# Patient Record
Sex: Male | Born: 1950
Health system: Southern US, Community
[De-identification: ages and names within clinical notes are randomized; demographics above are authoritative.]

## PROBLEM LIST (undated history)

## (undated) DIAGNOSIS — I4891 Unspecified atrial fibrillation: Secondary | ICD-10-CM

## (undated) DIAGNOSIS — N400 Enlarged prostate without lower urinary tract symptoms: Secondary | ICD-10-CM

## (undated) DIAGNOSIS — K635 Polyp of colon: Secondary | ICD-10-CM

## (undated) DIAGNOSIS — F329 Major depressive disorder, single episode, unspecified: Secondary | ICD-10-CM

## (undated) DIAGNOSIS — I2510 Atherosclerotic heart disease of native coronary artery without angina pectoris: Secondary | ICD-10-CM

## (undated) DIAGNOSIS — E785 Hyperlipidemia, unspecified: Secondary | ICD-10-CM

## (undated) DIAGNOSIS — M1990 Unspecified osteoarthritis, unspecified site: Secondary | ICD-10-CM

## (undated) DIAGNOSIS — Z8739 Personal history of other diseases of the musculoskeletal system and connective tissue: Secondary | ICD-10-CM

## (undated) DIAGNOSIS — F32A Depression, unspecified: Secondary | ICD-10-CM

## (undated) DIAGNOSIS — I10 Essential (primary) hypertension: Secondary | ICD-10-CM

## (undated) DIAGNOSIS — E669 Obesity, unspecified: Secondary | ICD-10-CM

## (undated) DIAGNOSIS — G4733 Obstructive sleep apnea (adult) (pediatric): Secondary | ICD-10-CM

## (undated) DIAGNOSIS — F419 Anxiety disorder, unspecified: Secondary | ICD-10-CM

## (undated) DIAGNOSIS — K219 Gastro-esophageal reflux disease without esophagitis: Secondary | ICD-10-CM

## (undated) HISTORY — PX: COLONOSCOPY: SHX174

## (undated) HISTORY — DX: Personal history of other diseases of the musculoskeletal system and connective tissue: Z87.39

## (undated) HISTORY — PX: TONSILLECTOMY AND ADENOIDECTOMY: SUR1326

## (undated) HISTORY — PX: BACK SURGERY: SHX140

## (undated) HISTORY — DX: Atherosclerotic heart disease of native coronary artery without angina pectoris: I25.10

## (undated) HISTORY — DX: Obesity, unspecified: E66.9

## (undated) HISTORY — DX: Essential (primary) hypertension: I10

## (undated) HISTORY — DX: Unspecified osteoarthritis, unspecified site: M19.90

## (undated) HISTORY — PX: CARPAL TUNNEL RELEASE: SHX101

## (undated) HISTORY — DX: Gastro-esophageal reflux disease without esophagitis: K21.9

## (undated) HISTORY — DX: Unspecified atrial fibrillation: I48.91

## (undated) HISTORY — DX: Depression, unspecified: F32.A

## (undated) HISTORY — DX: Obstructive sleep apnea (adult) (pediatric): G47.33

## (undated) HISTORY — DX: Major depressive disorder, single episode, unspecified: F32.9

## (undated) HISTORY — DX: Anxiety disorder, unspecified: F41.9

## (undated) HISTORY — PX: SHOULDER SURGERY: SHX246

## (undated) HISTORY — DX: Hyperlipidemia, unspecified: E78.5

## (undated) HISTORY — DX: Benign prostatic hyperplasia without lower urinary tract symptoms: N40.0

## (undated) HISTORY — DX: Polyp of colon: K63.5

## (undated) HISTORY — PX: KNEE ARTHROSCOPY: SHX127

---

## 1996-05-18 HISTORY — PX: CARDIAC CATHETERIZATION: SHX172

## 2000-07-16 ENCOUNTER — Ambulatory Visit (HOSPITAL_COMMUNITY): Admission: RE | Admit: 2000-07-16 | Discharge: 2000-07-16 | Payer: Self-pay | Admitting: Gastroenterology

## 2000-08-25 ENCOUNTER — Ambulatory Visit (HOSPITAL_BASED_OUTPATIENT_CLINIC_OR_DEPARTMENT_OTHER): Admission: RE | Admit: 2000-08-25 | Discharge: 2000-08-25 | Payer: Self-pay | Admitting: Otolaryngology

## 2001-12-21 ENCOUNTER — Encounter: Payer: Self-pay | Admitting: Otolaryngology

## 2001-12-21 ENCOUNTER — Encounter: Admission: RE | Admit: 2001-12-21 | Discharge: 2001-12-21 | Payer: Self-pay | Admitting: Otolaryngology

## 2003-05-19 HISTORY — PX: CARDIAC CATHETERIZATION: SHX172

## 2003-08-14 ENCOUNTER — Ambulatory Visit (HOSPITAL_COMMUNITY): Admission: RE | Admit: 2003-08-14 | Discharge: 2003-08-14 | Payer: Self-pay | Admitting: Cardiovascular Disease

## 2003-09-10 ENCOUNTER — Ambulatory Visit (HOSPITAL_BASED_OUTPATIENT_CLINIC_OR_DEPARTMENT_OTHER): Admission: RE | Admit: 2003-09-10 | Discharge: 2003-09-10 | Payer: Self-pay | Admitting: Internal Medicine

## 2004-05-13 ENCOUNTER — Encounter: Admission: RE | Admit: 2004-05-13 | Discharge: 2004-05-13 | Payer: Self-pay | Admitting: Internal Medicine

## 2006-04-13 ENCOUNTER — Ambulatory Visit (HOSPITAL_COMMUNITY): Admission: RE | Admit: 2006-04-13 | Discharge: 2006-04-14 | Payer: Self-pay | Admitting: Neurosurgery

## 2006-04-13 IMAGING — CR DG CERVICAL SPINE 2 OR 3 VIEWS
1 series · 1 of 1 positions shown · non-contrast
Comparison: none

CLINICAL DATA: 55-year-old, spondylolisthesis, stenosis, cervical fusion. 
 CERVICAL SPINE ? 2 LATERAL VIEWS FROM THE OPERATING ROOM:

[view not recorded]
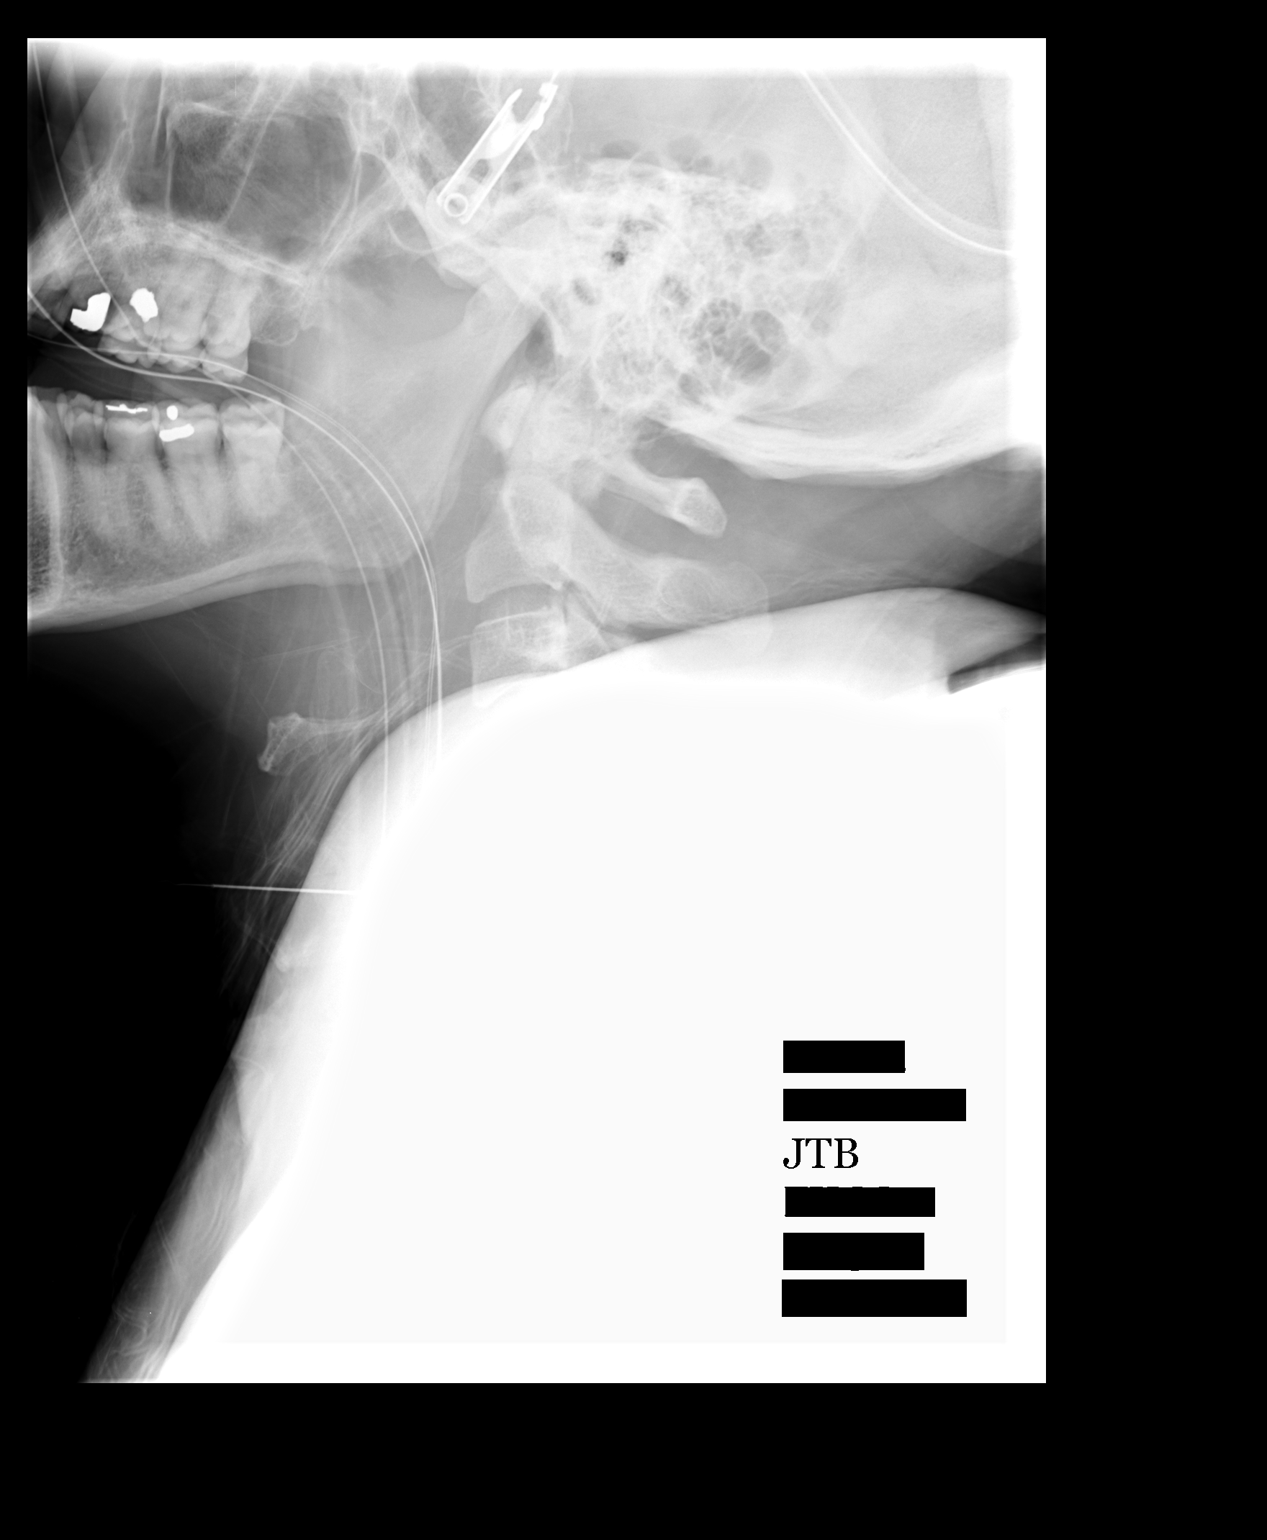

[1 of 1 positions shown; findings below may reference images not displayed]

FINDINGS: Lateral film labeled number 2 demonstrates a spinal needle projecting at the C4-5 disc space and C5-6 disc space.
IMPRESSION: C4-5 and C5-6 marked intraoperatively.

## 2010-02-18 ENCOUNTER — Ambulatory Visit: Payer: Self-pay | Admitting: Cardiology

## 2010-10-03 NOTE — Procedures (Signed)
[facility]  Patient:    Christopher Bradford, Christopher Bradford                    MRN: [id number] Proc. Date: 07/16/00 Adm. Date:  [id number] Disc. Date: [id number] Attending:  Nelda Marseille CC:         Joycelyn Rua, M.D.   Procedure Report  PROCEDURE:  Esophagogastroduodenoscopy with biopsy.  INDICATION:  Guaiac positivity.  Upper tract symptoms.  Nondiagnostic colonoscopy.  INFORMED CONSENT:  Consent was signed after risks, benefits, methods, and options were thoroughly discussed prior to any premedications given and in the office.  ADDITIONAL MEDICINES:  Versed 1 only.  DESCRIPTION OF PROCEDURE:  The video endoscope was inserted by direct vision. The proximal and mid esophagus were normal.  Distal esophagus was pertinent for a small hiatal hernia.  No signs of Barretts.  Possibly a minimal amount of distal esophagitis was seen.  The scope was inserted into the stomach and advanced to the antrum where some mild linear antritis was seen and advanced through a normal pylorus into the duodenal bulb, pertinent for some minimal bulbitis and around the C-loop to a normal second portion of the duodenum. The scope was withdrawn back to bulb, and a good look there ruled out ulcers in that location but confirmed the mild inflammation.  The scope was withdrawn back to the stomach and retroflexed.  Cardia, fundus, angularis, lesser and greater curve were normal on retroflexed and straight visualization, except for some minimal gastritis.  The scope was then advanced to the antrum.  One biopsy for the CLOtest to rule out Helicobacter was obtained.  Air was suctioned, and the scope was slowly withdrawn.  Again, a good look at the esophagus on slow withdrawal confirmed the above findings.  The scope was removed.  The patient tolerated the procedure well.  There was no obvious immediate complications.  ENDOSCOPIC DIAGNOSES: 1. Small hiatal hernia. 2. Mild linear antritis,  bulbitis, gastritis, status post CLOtest biopsy. 3. Otherwise normal limits EGD.  PLAN:  Will try Prevacid since Prilosec caused some diarrhea.  We will go ahead and see back in about six weeks to recheck guaiacs, possibly CBC, and symptoms and make sure no further work up plans are needed.  We will need to check the guaiacs off aspirin and nonsteroidals. DD:  07/16/00 TD:  07/18/00 Job: [id number]

## 2010-10-03 NOTE — H&P (Signed)
NAME:  Christopher Bradford, Christopher Bradford NO.:  [id number]   MEDICAL RECORD NO.:  [id number]                    PATIENT TYPE:   LOCATION:                                       FACILITY:   PHYSICIAN:  Vesta Mixer, M.D.              DATE OF BIRTH:   DATE OF ADMISSION:  08/14/2003  DATE OF DISCHARGE:                                HISTORY & PHYSICAL   Christopher Bradford is a middle-aged gentleman with a history of coronary artery disease.  He has history of inferior wall myocardial infarction with PTCA and stenting  of an occluded left circumflex artery by Dr. Delfin Edis in August 1998.  He had done fairly well until approximately one month ago when he started  having some unusual episodes of chest pain.  He thought that these episodes  of chest pain were due to a bad cold and then later thought it was due to a  new medication.  For the past week or so, his cold has resolved, and he  stopped the medication.  He still continues to have these episodes of chest  pain.  He describes the chest pain as a knot in his chest.  It seems to  worsen with exertion and seems to be relieved with rest.  He has also had  some episodes of tachy palpitations and irregular heart beat.  He has also  had occasional episodes when he gets very clammy, weak, and fatigued.  He  has not taken any nitroglycerin.   CURRENT MEDICATIONS:  1. Lotensin 40 mg a day.  2. Bayer aspirin once a day.  3. Zocor 40 mg a day.  4. Lexapro 5 mg a day (none in the last week).  5. Nexium 40 mg a day.  6. Zyrtec 5 mg a day.  7. Multivitamins once a day.   ALLERGIES:  None.   PAST MEDICAL HISTORY:  1. History of coronary artery disease, status post PTCA and stenting of left     circumflex artery in 1998.  2. Hypertension.  3. Hypercholesterolemia.   SOCIAL HISTORY:  The patient works in [employer].  He does not  smoke.   FAMILY HISTORY:  Noncontributory.   REVIEW OF SYSTEMS:  Was reviewed and is  essentially negative.   PHYSICAL EXAMINATION:  GENERAL:  He is a middle-aged gentleman in no acute  distress.  He is alert and oriented x 3, and his mood and affect are normal.  VITAL SIGNS:  Weight 286.  Blood pressure 138/88, heart rate 93.  HEAD AND NECK:  Reveals 2+ carotids.  He has no bruits.  There is no JVD nor  thyromegaly.  LUNGS:  Clear to auscultation.  HEART:  Regular rate, S1, S2, with no murmurs, gallops, or rubs.  ABDOMEN:  Good bowel sounds and nontender.  EXTREMITIES:  No clubbing, cyanosis, or edema.  NEUROLOGIC:  Exam nonfocal.   LABORATORY AND  X-RAY DATA:  EKG reveals normal sinus rhythm.  He has  inferior Q waves.  He has no ST or T wave changes.   IMPRESSION:  Christopher Bradford presents with some episodes of exertional chest pain.  I  have recommended that we perform a heart catheterization because of his  history of coronary disease and because of his symptoms.  We have discussed  the risks, benefits, and options of heart catheterization.  He understands  and agrees to proceed.                                                Vesta Mixer, M.D.    PJN/MEDQ  D:  08/13/2003  T:  08/13/2003  Job:  [id number]   cc:   Cassell Clement, M.D.  [address]  [place]  [place] [address]  Fax: [phone]

## 2010-10-03 NOTE — Cardiovascular Report (Signed)
NAME:  Christopher Bradford, BROZEK NO.:  [id number]   MEDICAL RECORD NO.:  [id number]                   PATIENT TYPE:  OIB   LOCATION:  2899                                 FACILITY:  [facility]   PHYSICIAN:  Vesta Mixer, M.D.              DATE OF BIRTH:  February 07, 1951   DATE OF PROCEDURE:  08/14/2003  DATE OF DISCHARGE:  08/14/2003                              CARDIAC CATHETERIZATION   Mr. Groene is a 60 year old gentleman with history of coronary artery  disease.  He is status post percutaneous transluminal coronary angioplasty  and stenting of his left circumflex marginal branch several years ago.  He  presented to the office the other day with symptoms consistent with unstable  angina.   PROCEDURE:  Left heart catheterization with coronary angiography.   The right femoral artery was easily cannulated using the modified Seldinger  technique.   HEMODYNAMICS:  The left ventricular pressure was 133/9 with an aortic  pressure of 133/66.   CORONARY ANGIOGRAPHY:  1. Left main coronary artery is smooth and normal.  2. The left anterior descending artery has minor luminal irregularities     especially in the proximal segment. These irregularities range between 10     and 20%.  The diagonal vessels are small, but otherwise normal.  3. The left circumflex artery is large vessel.  It gives off a large obtuse     marginal artery.  The stent is positioned in the proximal aspect of this     first OM.  This stent is widely patent.  4. The right coronary artery is a moderate size vessel and is dominant.  It     is fairly smooth and normal throughout its course.  The posterior     descending artery and the posterior lateral segment are normal.   LEFT VENTRICULOGRAM:  Left ventriculogram was performed in a 30-RAO  position.  It reveals overall normal left ventricular systolic function.  There is no significant mitral regurgitation.  There are no segmental wall  motion  abnormalities.   COMPLICATIONS:  None.   CONCLUSIONS:  Minor luminal irregularities.  The previously placed left  circumflex stent is widely patent.  We will continue with medical therapy.                                               Vesta Mixer, M.D.    PJN/MEDQ  D:  08/14/2003  T:  08/15/2003  Job:  [id number]   cc:   Cassell Clement, M.D.  [address]  [place]  [place] [address]  Fax: [phone]

## 2010-10-03 NOTE — Procedures (Signed)
[facility]  Patient:    Christopher Bradford, Christopher Bradford                    MRN: [id number] Proc. Date: 07/16/00 Adm. Date:  [id number] Disc. Date: [id number] Attending:  Nelda Marseille CC:         Joycelyn Rua, M.D.   Procedure Report  PROCEDURE:  Colonoscopy.  INDICATIONS:  Guaiac positivity, due for colonic screening.  INFORMED CONSENT:  Consent was signed after risk, benefits, methods and options were thoroughly discussed in the office.  MEDICINES USED:  Demerol 50 mg, Versed 6 mg.  DESCRIPTION OF PROCEDURE:  Rectal inspection was pertinent for external hemorrhoids, small.  Digital exam was negative.  The video colonoscope was inserted and easily advanced to the level of the ileocecal valve to advance to the cecum pole, required abdominal pressure.  The cecum was identified by the appendiceal orifice and the ileocecal valve.  The scope was inserted a short ways in the terminal ileum which was normal.  Photo documentation was obtained.  The scope was slowly withdrawn.  On insertion, no obvious abnormality was seen on slow withdrawal through the colon other than a rare, left-sided diverticula.  No abnormalities were seen.  The prep was adequate. There was some liquid stool that required washing and suctioning.  Once back in the rectum, the scope was retroflexed, pertinent for some internal hemorrhoids.  The scope was straightened, air was withdrawn, and the scope removed.  The patient tolerated the procedure well, and there was no obvious immediate complication.  ENDOSCOPIC DIAGNOSES: 1. Internal and external hemorrhoids. 2. Rare left-sided diverticula. 3. Otherwise within normal limits to the terminal ileum.  PLAN:  Continue work-up with an EGD and probably follow-up in 6-8 weeks to recheck guaiac symptoms and possibly CBC and decide any other work-up and plans.  Repeat screening in five years. DD:  07/16/00 TD:  07/18/00 Job: [id number]

## 2010-10-03 NOTE — Op Note (Signed)
NAME:  [name], [name] NO.:  [id number]   MEDICAL RECORD NO.:  [id number]          PATIENT TYPE:  AMB   LOCATION:  SDS                          FACILITY:  [facility]   PHYSICIAN:  Danae Orleans. Venetia Maxon, M.D.  DATE OF BIRTH:  12/01/1950   DATE OF PROCEDURE:  04/13/2006  DATE OF DISCHARGE:                               OPERATIVE REPORT   PREOPERATIVE DIAGNOSIS:  Herniated cervical disk with cervical  myelopathy, spondylosis with myelopathy, stenosis, degenerative disk  disease and cervical radiculopathy C5-6 and C6-7 levels.   POSTOPERATIVE DIAGNOSIS:  Herniated cervical disk with cervical  myelopathy, spondylosis with myelopathy, stenosis, degenerative disk  disease and cervical radiculopathy C5-6 and C6-7 levels.   PROCEDURE:  Anterior cervical decompression and fusion, C5-6 and C6-7  with PEEK interbody spacers, morselized bone autograft and Osteocel and  anterior cervical plate.   SURGEON:  Danae Orleans. Venetia Maxon, M.D.   ASSISTANT:  Hewitt Shorts, M.D.  Georgiann Cocker, RN   ANESTHESIA:  General endotracheal anesthesia.   BLOOD LOSS:  Minimal.   COMPLICATIONS:  None.   DISPOSITION:  Recovery.   INDICATIONS:  Christopher Bradford is a 60 year old man with cervical  myelopathy and cervical stenosis with herniated disks at C5-6 and C6-7  levels.  It was elected to take him to surgery for anterior cervical  decompression and fusion at C5-6 and C6-7 levels.   PROCEDURE:  Christopher Bradford was brought to the operating room.  Following  satisfactory and uncomplicated induction of general endotracheal  anesthesia and placement of intravenous lines, the patient was placed in  a supine position on the operating table.  The neck was placed in  neutral alignment.  He is placed in 10 pounds of halter traction and  anterior neck was then shaved, prepped and draped in the usual sterile  fashion.  Shoulders were wrapped with Kerlix and pulled because the  patient is approximately 270  pounds.  The area of planned incision was  infiltrated with 0.25% Marcaine and 0.5% lidocaine with 1:200,000  epinephrine.  Incision was made from midline to the anterior border of  sternocleidomastoid muscle, carried sharply through the platysma layer.  Subplatysmal dissection was performed exposing the anterior border of  the sternocleidomastoid muscle.  Using blunt dissection the carotid  sheath was kept lateral.  Trachea and esophagus kept medial exposing the  anterior cervical spine initially and intraoperative x-ray was obtained  with marking needle at the C5-6 level.  Subsequently it was not possible  to visualize this so an additional needle was placed at what was felt to  the C4-5 and this was confirmed on intraoperative x-ray.  Subsequently  the longus colli muscles were taken down from the anterior cervical  spine from C5-C7 with electrocautery and Key elevator and the large  anterior osteophytes were removed with Leksell rongeur at each of these  levels.  The self-retaining Shadow Line retractors were placed along  with the up and down retractors as well and the interspaces at each  these levels was then incised with 15 blade.  Disk material was removed  in piecemeal fashion.  The endplates  were stripped of residual disk  material using Carlens curettes and under loupe magnification using a  high-speed drill, the endplates of C5-C6 were decorticated as were  uncinates and uncinate spurs were drilled down and subsequently similar  decompression was performed at the C6-7 level.  The disk space spreader  was placed at C6-7 and the microscope was brought to the field and the  posterior longitudinal ligament was removed in piecemeal fashion using a  variety of gold-tip Kerrison rongeurs and both C7 nerve roots were  decompressed widely as they extended out the neural foramina as central  spinal cord dura.  Endplates had been decorticated and after trial  sizing, an 8-mm PEEK  interbody cage was selected, packed with  morcellized bone autograft which had been retained from the drilling of  the endplates along with Osteocel.  This was inserted in the interspace  and countersunk appropriately. Attention was then turned to the C5-6  level where similar decompression was performed.  At this level there  was a central disk herniation with compression of the spinal cord dura.  The posterior longitudinal ligament was removed in piecemeal fashion.  Both C6 nerve roots and central spinal cord dura were decompressed.  Hemostasis was assured with Gelfoam soaked in thrombin.  After trial  sizing, a 7-mm medium PEEK cage was selected, packed with similar  autograft and Osteocel and then inserted in the interspace countersunk  appropriately.  A 37 mm trestle anterior cervical plate was then affixed  to the anterior cervical spine using at C5, C6 and C7 levels using 14 mm  variable angle screws.  All screws had excellent purchase and locking  mechanisms were engaged.  The traction weight was removed prior to  placing the plate.  The wound was copiously irrigated with Bacitracin  saline.  The soft tissues were inspected and found to be good repair.  No evidence of any bleeding.  The platysma layer was then closed with 3-  0 Vicryl sutures and the skin edges reapproximated 3-0 Vicryl  interrupted inverted sutures.  The wound was dressed with Dermabond.  The patient was extubated in the operative room taken to recovery room  in stable satisfactory condition having tolerated his operation well.      Danae Orleans. Venetia Maxon, M.D.  Electronically Signed     JDS/MEDQ  D:  04/13/2006  T:  04/13/2006  Job:  [id number]

## 2010-10-14 ENCOUNTER — Encounter: Payer: Self-pay | Admitting: Cardiology

## 2010-10-14 ENCOUNTER — Encounter: Payer: Self-pay | Admitting: Internal Medicine

## 2011-04-16 ENCOUNTER — Encounter: Payer: Self-pay | Admitting: *Deleted

## 2011-04-16 DIAGNOSIS — Z8739 Personal history of other diseases of the musculoskeletal system and connective tissue: Secondary | ICD-10-CM | POA: Insufficient documentation

## 2011-04-16 DIAGNOSIS — I259 Chronic ischemic heart disease, unspecified: Secondary | ICD-10-CM | POA: Insufficient documentation

## 2011-04-20 ENCOUNTER — Encounter: Payer: Self-pay | Admitting: Cardiology

## 2011-04-20 ENCOUNTER — Ambulatory Visit (INDEPENDENT_AMBULATORY_CARE_PROVIDER_SITE_OTHER): Payer: Managed Care, Other (non HMO) | Admitting: Cardiology

## 2011-04-20 VITALS — BP 130/88 | HR 70 | Ht 70.0 in | Wt 230.0 lb

## 2011-04-20 DIAGNOSIS — E785 Hyperlipidemia, unspecified: Secondary | ICD-10-CM | POA: Insufficient documentation

## 2011-04-20 DIAGNOSIS — I119 Hypertensive heart disease without heart failure: Secondary | ICD-10-CM

## 2011-04-20 DIAGNOSIS — I259 Chronic ischemic heart disease, unspecified: Secondary | ICD-10-CM

## 2011-04-20 DIAGNOSIS — E7800 Pure hypercholesterolemia, unspecified: Secondary | ICD-10-CM

## 2011-04-20 NOTE — Assessment & Plan Note (Signed)
No chest pain.  No shortness of breath.  No dizziness or palpitations.

## 2011-04-20 NOTE — Progress Notes (Signed)
Christopher Bradford Date of Birth:  04-24-1951 [facility] [address]   [address] [place], [place]  [address] [phone]           Fax   [phone]  HPI: This pleasant 60 year old gentleman is seen for right one-year followup office visit.  He has been feeling well.  He hasn't passed history of coronary artery disease and had a stent placed to the circumflex in 1998.  His last cardiac catheterization in 2005 showed that the left circumflex stent was still widely patent.  Patient is not expressing any chest pain or shortness of breath.  Has had a problem with exogenous obesity and unfortunately has gained 11 pounds over the past year.  Current Outpatient Prescriptions  Medication Sig Dispense Refill  . amLODipine (NORVASC) 2.5 MG tablet Take 2.5 mg by mouth daily.        Marland Kitchen aspirin 81 MG tablet Take 81 mg by mouth daily.        Marland Kitchen azelastine (ASTELIN) 137 MCG/SPRAY nasal spray Place 1 spray into the nose 2 (two) times daily. Use in each nostril as directed       . benazepril (LOTENSIN) 40 MG tablet Take 40 mg by mouth daily.        . cetirizine (ZYRTEC) 5 MG tablet Take 5 mg by mouth daily.        . Coenzyme Q10 (CO Q 10 PO) Take by mouth daily.        . Multiple Vitamin (MULTIVITAMIN) tablet Take 1 tablet by mouth daily.        . Omega-3 Fatty Acids (FISH OIL PO) Take by mouth daily.        . Omeprazole (PRILOSEC PO) Take by mouth daily.        . simvastatin (ZOCOR) 40 MG tablet Take 40 mg by mouth at bedtime.          Allergies  Allergen Reactions  . Lotensin     diarrhea  . Penicillins   . Pravachol     diarrhea    Patient Active Problem List  Diagnoses  . Ischemic heart disease  . History of gout    History  Smoking status  . Former Smoker  . Types: Cigarettes  Smokeless tobacco  . Not on file    History  Alcohol Use     No family history on file.  Review of Systems: The patient denies any heat or cold intolerance.  No weight  gain or weight loss.  The patient denies headaches or blurry vision.  There is no cough or sputum production.  The patient denies dizziness.  There is no hematuria or hematochezia.  The patient denies any muscle aches or arthritis.  The patient denies any rash.  The patient denies frequent falling or instability.  There is no history of depression or anxiety.  All other systems were reviewed and are negative.   Physical Exam: Filed Vitals:   04/20/11 1454  BP: 130/88  Pulse: 70   the general appearance reveals a large gentleman in no distress.The head and neck exam reveals pupils equal and reactive.  Extraocular movements are full.  There is no scleral icterus.  The mouth and pharynx are normal.  The neck is supple.  The carotids reveal no bruits.  The jugular venous pressure is normal.  The  thyroid is not enlarged.  There is no lymphadenopathy.  The chest is clear to percussion and auscultation.  There are no  rales or rhonchi.  Expansion of the chest is symmetrical.  The precordium is quiet.  The first heart sound is normal.  The second heart sound is physiologically split.  There is no murmur gallop rub or click.  There is no abnormal lift or heave.  The abdomen is soft and nontender.  The bowel sounds are normal.  The liver and spleen are not enlarged.  There are no abdominal masses.  There are no abdominal bruits.  Extremities reveal good pedal pulses.  There is no phlebitis or edema.  There is no cyanosis or clubbing.  Strength is normal and symmetrical in all extremities.  There is no lateralizing weakness.  There are no sensory deficits.  The skin is warm and dry.  There is no rash.      Assessment / Plan: The patient needs to work harder on weight loss and needs to get into a regular exercise program.  Recheck here in one year for office visit and EKG.

## 2011-04-20 NOTE — Patient Instructions (Signed)
Your physician recommends that you continue on your current medications as directed. Please refer to the Current Medication list given to you today. Work harder on diet and exercise Your physician wants you to follow-up in: 1 year  You will receive a reminder letter in the mail two months in advance. If you don't receive a letter, please call our office to schedule the follow-up appointment.

## 2011-04-20 NOTE — Assessment & Plan Note (Signed)
The patient has a history of dyslipidemia and is on simvastatin.  Is not having any side effects from simvastatin.  His lipids are followed by his primary care provider Dr. Eula Listen

## 2013-07-27 ENCOUNTER — Encounter: Payer: Self-pay | Admitting: Podiatry

## 2013-07-27 ENCOUNTER — Ambulatory Visit (INDEPENDENT_AMBULATORY_CARE_PROVIDER_SITE_OTHER): Payer: Managed Care, Other (non HMO) | Admitting: Podiatry

## 2013-07-27 DIAGNOSIS — M779 Enthesopathy, unspecified: Secondary | ICD-10-CM

## 2013-07-27 DIAGNOSIS — L84 Corns and callosities: Secondary | ICD-10-CM

## 2013-07-27 MED ORDER — TRIAMCINOLONE ACETONIDE 10 MG/ML IJ SUSP
10.0000 mg | Freq: Once | INTRAMUSCULAR | Status: AC
  Administered 2013-07-27: 10 mg

## 2013-07-29 NOTE — Progress Notes (Signed)
Subjective:     Patient ID: Christopher Bradford, male   DOB: [birth year], 64 y.o.   MRN: [id number]  HPI patient is found to have lesion with pain fifth metatarsal head left that has fluid buildup within the plantar capsule   Review of Systems     Objective:   Physical Exam Neurovascular status intact with inflammation around the head of the fifth metatarsal left that it's painful with keratotic lesion noted    Assessment:     Capsulitis left fifth MPJ with inflammation and pain and porokeratotic lesion    Plan:     Injected the plantar capsule 3 mg dexamethasone Kenalog combination along with Xylocaine and debridement the lesion

## 2014-01-12 ENCOUNTER — Ambulatory Visit: Payer: Managed Care, Other (non HMO) | Admitting: Podiatry

## 2014-01-23 ENCOUNTER — Other Ambulatory Visit: Payer: Self-pay | Admitting: Internal Medicine

## 2014-01-23 ENCOUNTER — Ambulatory Visit
Admission: RE | Admit: 2014-01-23 | Discharge: 2014-01-23 | Disposition: A | Discharge status: Home / Self Care | Payer: Self-pay | Source: Ambulatory Visit | Attending: Internal Medicine | Admitting: Internal Medicine

## 2014-01-23 DIAGNOSIS — M79672 Pain in left foot: Secondary | ICD-10-CM

## 2014-02-08 ENCOUNTER — Encounter: Payer: Self-pay | Admitting: Podiatry

## 2014-02-08 ENCOUNTER — Ambulatory Visit (INDEPENDENT_AMBULATORY_CARE_PROVIDER_SITE_OTHER): Payer: BC Managed Care – PPO | Admitting: Podiatry

## 2014-02-08 ENCOUNTER — Ambulatory Visit (INDEPENDENT_AMBULATORY_CARE_PROVIDER_SITE_OTHER): Payer: BC Managed Care – PPO

## 2014-02-08 VITALS — BP 163/87 | HR 67 | Resp 15 | Ht 69.0 in | Wt 230.0 lb

## 2014-02-08 DIAGNOSIS — M79673 Pain in unspecified foot: Secondary | ICD-10-CM

## 2014-02-08 DIAGNOSIS — L84 Corns and callosities: Secondary | ICD-10-CM

## 2014-02-08 DIAGNOSIS — M898X9 Other specified disorders of bone, unspecified site: Secondary | ICD-10-CM

## 2014-02-08 DIAGNOSIS — M79609 Pain in unspecified limb: Secondary | ICD-10-CM

## 2014-02-08 NOTE — Progress Notes (Signed)
   Subjective:    Patient ID: Christopher Bradford, male    DOB: 28-Nov-1950, 63 y.o.   MRN: [id number]  HPI Comments: Mr. Hurn, returns to the office today for complaints of right 5th digit callus as well as a painful area on the top of his left foot. The calluses on his right foot have been present for about 6 months and he has had no prior treatment. In July he states he had increased pain to the left foot and thought he had gout.  He states he saw his PCP and had x-rays obtained of his left foot and was told he did not have a fracture.Took some displacement medication for gout he states the pain did not resolve so therefore he did not think he was gout. After then saw his primary care physician who also do not think that it was gout. He had to change his shoes to take the pressure off the top of foot and the pain decreased. Denies any history of injury or trauma to the area. No other complaints at this time.   Foot Pain      Review of Systems  All other systems reviewed and are negative.      Objective:   Physical Exam AAO x3, NAD DP/PT pulses palpable 2/4 b/l, CRT < 3 sec Protective sensation intact with SWMF Dorsal exostosis over the left midfoot, medially. No identifiable soft tissue mass. No overlying erythema or open lesion. Mild amount of localized edema directly over the dorsal exostosis. Mild tenderness over the site. No pain with vibratory sensation. No increase in warmth over the area. Right dorsal lateral fifth digit hyperkeratotic lesion as well as medial fifth digit hyperkeratotic lesion. Adductovarus deformity of the fifth digit. No surrounding erythema.  No open lesions. No calf pain, swelling, warmth. 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 -X-rays were reviewed on the left side his PCP and were obtained of the  right side. -Conservative versus surgical treatment were discussed including alternatives, risks, complications. -Hyperkeratotic lesions sharply debrided x2 on the right fifth digit without complication. -Recommended offloading of the left foot dorsal exostosis to prevent pressure causing irritation. Recommended releasing of his shoes to skip a hole in order to alleviate pressure in shoe gear. Also recommended possible orthotic therapy to help support his foot structure to prevent collapse of the medial arch upon weight bearing. Discussed-offloading to the fifth digit as well on the right foot. -Followup as needed. Call any questions, concerns, change in symptoms.

## 2014-03-21 ENCOUNTER — Encounter: Payer: Self-pay | Admitting: Cardiology

## 2014-03-21 ENCOUNTER — Ambulatory Visit (INDEPENDENT_AMBULATORY_CARE_PROVIDER_SITE_OTHER): Payer: BC Managed Care – PPO | Admitting: Cardiology

## 2014-03-21 VITALS — BP 126/78 | HR 71 | Ht 70.0 in | Wt 245.0 lb

## 2014-03-21 DIAGNOSIS — E785 Hyperlipidemia, unspecified: Secondary | ICD-10-CM

## 2014-03-21 DIAGNOSIS — I10 Essential (primary) hypertension: Secondary | ICD-10-CM | POA: Insufficient documentation

## 2014-03-21 DIAGNOSIS — I259 Chronic ischemic heart disease, unspecified: Secondary | ICD-10-CM

## 2014-03-21 NOTE — Assessment & Plan Note (Signed)
The patient is on simvastatin.  He is not having any myalgias or side effects.

## 2014-03-21 NOTE — Progress Notes (Signed)
Christopher Bradford Date of Birth:  12/19/1950 [facility] [address] [place], [place]  [address] [phone]        Fax   [phone]   History of Present Illness: This pleasant 63 year old gentleman is seen after a three-year absence.  He is a medical patient of Dr. Deforest Hoyles.  The patient has a past history of ischemic heart disease.  He has a past history of a stent to the circumflex in 1998.  He had a cardiac catheterization in 2005 showing that the left circumflex stent was widely patent.  Patient is on statin therapy monitored by his PCP.  The patient has a history of hypercholesterolemia and essential hypertension and exogenous obesity.  The patient has had a lot of stress in the past year regarding a job change.  He has gained 15 pounds since we last saw him.  Current Outpatient Prescriptions  Medication Sig Dispense Refill  . amLODipine (NORVASC) 2.5 MG tablet Take 2.5 mg by mouth daily.      Marland Kitchen aspirin 81 MG tablet Take 81 mg by mouth daily.      Marland Kitchen azelastine (ASTELIN) 137 MCG/SPRAY nasal spray Place 1 spray into the nose 2 (two) times daily. Use in each nostril as directed     . benazepril (LOTENSIN) 40 MG tablet Take 40 mg by mouth daily.      . cetirizine (ZYRTEC) 5 MG tablet Take 5 mg by mouth daily.      . Coenzyme Q10 (CO Q 10 PO) Take by mouth daily.      . diazepam (VALIUM) 2 MG tablet     . flurazepam (DALMANE) 15 MG capsule   3  . Multiple Vitamin (MULTIVITAMIN) tablet Take 1 tablet by mouth daily.      . Omega-3 Fatty Acids (FISH OIL PO) Take by mouth daily.      . Omeprazole (PRILOSEC PO) Take by mouth daily.      . simvastatin (ZOCOR) 40 MG tablet Take 40 mg by mouth at bedtime.       No current facility-administered medications for this visit.    Allergies  Allergen Reactions  . Penicillins     Patient Active Problem List   Diagnosis Date Noted  . Dyslipidemia 04/20/2011  . Ischemic heart disease   . History of gout      History  Smoking status  . Former Smoker  . Types: Cigarettes  Smokeless tobacco  . Not on file    History  Alcohol Use: Not on file    No family history on file.  Review of Systems: Constitutional: no fever chills diaphoresis or fatigue or change in weight.  Head and neck: no hearing loss, no epistaxis, no photophobia or visual disturbance. Respiratory: No cough, shortness of breath or wheezing. Cardiovascular: No chest pain peripheral edema, palpitations. Gastrointestinal: No abdominal distention, no abdominal pain, no change in bowel habits hematochezia or melena. Genitourinary: No dysuria, no frequency, no urgency, no nocturia. Musculoskeletal:No arthralgias, no back pain, no gait disturbance or myalgias. Neurological: No dizziness, no headaches, no numbness, no seizures, no syncope, no weakness, no tremors. Hematologic: No lymphadenopathy, no easy bruising. Psychiatric: No confusion, no hallucinations, no sleep disturbance.    Physical Exam: Filed Vitals:   03/21/14 1603  BP: 126/78  Pulse: 71  The patient appears to be in no distress. There is obesity  Head and neck exam reveals that the pupils are equal and reactive.  The extraocular movements are  full.  There is no scleral icterus.  Mouth and pharynx are benign.  No lymphadenopathy.  No carotid bruits.  The jugular venous pressure is normal.  Thyroid is not enlarged or tender.  Chest is clear to percussion and auscultation.  No rales or rhonchi.  Expansion of the chest is symmetrical.  Heart reveals no abnormal lift or heave.  First and second heart sounds are normal.  There is no murmur gallop rub or click.  The abdomen is soft and nontender.  Bowel sounds are normoactive.  There is no hepatosplenomegaly or mass.  There are no abdominal bruits.  Extremities reveal no phlebitis or edema.  Pedal pulses are good.  There is no cyanosis or clubbing.  Neurologic exam is normal strength and no lateralizing  weakness.  No sensory deficits.  Integument reveals no rash  EKG today shows sinus rhythm with occasional PAC.  No ischemic changes.  Assessment / Plan: 1. Ischemic heart disease with previous stent to circumflex coronary artery in 1998.  Cardiac catheterization in 2005 showed that the stent was widely patent. 2.  Essential hypertension 3.  Obesity 4.  Hypercholesterolemia 5. Osteoarthritis of both knees Disposition: Continue current medication.  I encouraged him to get back on a careful diet and to lose weight and to increase his walking exercise.  He states he is unable to do much walking because of his knee arthritis.  He is able to swim and that is his best exercise activity. Recheck in one year for office visit and EKG

## 2014-03-21 NOTE — Assessment & Plan Note (Signed)
Blood pressure has remained normal on amlodipine and benazepril.  He is not having any headaches or dizziness or shortness of breath.

## 2014-03-21 NOTE — Patient Instructions (Signed)
Your physician recommends that you continue on your current medications as directed. Please refer to the Current Medication list given to you today.  Your physician wants you to follow-up in: 1 year ov/ekg You will receive a reminder letter in the mail two months in advance. If you don't receive a letter, please call our office to schedule the follow-up appointment.  

## 2014-03-21 NOTE — Assessment & Plan Note (Signed)
The patient has not been experiencing any recurrent chest discomfort or angina pectoris 

## 2014-04-30 ENCOUNTER — Ambulatory Visit (INDEPENDENT_AMBULATORY_CARE_PROVIDER_SITE_OTHER): Payer: BC Managed Care – PPO | Admitting: Podiatry

## 2014-04-30 ENCOUNTER — Encounter: Payer: Self-pay | Admitting: Podiatry

## 2014-04-30 VITALS — BP 114/73 | HR 83 | Resp 18

## 2014-04-30 DIAGNOSIS — L84 Corns and callosities: Secondary | ICD-10-CM

## 2014-04-30 DIAGNOSIS — M779 Enthesopathy, unspecified: Secondary | ICD-10-CM

## 2014-04-30 MED ORDER — TRIAMCINOLONE ACETONIDE 10 MG/ML IJ SUSP
10.0000 mg | Freq: Once | INTRAMUSCULAR | Status: AC
  Administered 2014-04-30: 10 mg

## 2014-05-02 NOTE — Progress Notes (Signed)
Subjective:     Patient ID: Christopher Bradford, male   DOB: 09-May-1951, 63 y.o.   MRN: [id number]  HPI patient states I have inflammation and fluid around my fifth metatarsal head left with keratotic lesion that's been very painful when pressed   Review of Systems     Objective:   Physical Exam Neurovascular status and intact with chronic capsulitis sub-fifth metatarsal left which occurs about every 9 months to one year with fluid buildup and lesion formation    Assessment:     Inflamed capsule fifth metatarsal left foot with pain and lesion formation    Plan:     Injected the capsule 3 mg dexamethasone Kenalog 5 mg Xylocaine and debrided the lesion fully and instructed on physical therapy and reappoint

## 2014-07-31 DIAGNOSIS — K219 Gastro-esophageal reflux disease without esophagitis: Secondary | ICD-10-CM | POA: Diagnosis not present

## 2014-07-31 DIAGNOSIS — E782 Mixed hyperlipidemia: Secondary | ICD-10-CM | POA: Diagnosis not present

## 2014-07-31 DIAGNOSIS — I10 Essential (primary) hypertension: Secondary | ICD-10-CM | POA: Diagnosis not present

## 2014-07-31 DIAGNOSIS — G4733 Obstructive sleep apnea (adult) (pediatric): Secondary | ICD-10-CM | POA: Diagnosis not present

## 2014-07-31 DIAGNOSIS — J309 Allergic rhinitis, unspecified: Secondary | ICD-10-CM | POA: Diagnosis not present

## 2014-09-19 DIAGNOSIS — M1711 Unilateral primary osteoarthritis, right knee: Secondary | ICD-10-CM | POA: Diagnosis not present

## 2014-09-19 DIAGNOSIS — M1712 Unilateral primary osteoarthritis, left knee: Secondary | ICD-10-CM | POA: Diagnosis not present

## 2014-11-15 ENCOUNTER — Encounter: Payer: Self-pay | Admitting: Podiatry

## 2014-11-15 ENCOUNTER — Ambulatory Visit: Payer: Medicare Other

## 2014-11-15 ENCOUNTER — Ambulatory Visit (INDEPENDENT_AMBULATORY_CARE_PROVIDER_SITE_OTHER): Payer: Medicare Other | Admitting: Podiatry

## 2014-11-15 VITALS — BP 126/71 | HR 71 | Resp 15

## 2014-11-15 DIAGNOSIS — D169 Benign neoplasm of bone and articular cartilage, unspecified: Secondary | ICD-10-CM | POA: Diagnosis not present

## 2014-11-15 DIAGNOSIS — M779 Enthesopathy, unspecified: Secondary | ICD-10-CM | POA: Diagnosis not present

## 2014-11-15 DIAGNOSIS — L851 Acquired keratosis [keratoderma] palmaris et plantaris: Secondary | ICD-10-CM

## 2014-11-15 DIAGNOSIS — L84 Corns and callosities: Secondary | ICD-10-CM

## 2014-11-15 MED ORDER — TRIAMCINOLONE ACETONIDE 10 MG/ML IJ SUSP
10.0000 mg | Freq: Once | INTRAMUSCULAR | Status: AC
  Administered 2014-11-15: 10 mg

## 2014-11-15 NOTE — Progress Notes (Signed)
Subjective:     Patient ID: Christopher Bradford, male   DOB: 1950-07-22, 64 y.o.   MRN: [id number]  HPI patient states I'm having a lot of pain in the outside of his left foot with these lesions and it's lasting less and less and I may ultimately require surgery and I'm also having pain on top of the foot it might be a spur formation   Review of Systems     Objective:   Physical Exam Neurovascular status intact no other change in health history with keratotic lesions that have lucent course just distal to the fifth metatarsal head left with fluid buildup noted and also enlargement of the metatarsocuneiform joint left mid foot with pain when palpated    Assessment:     Inflammatory capsulitis left fifth MPJ and distal along with lesion formation consistent with porokeratosis and tarsal exostosis left    Plan:     Reviewed conditions and how short the treatment is area today I did do a capsular injection left 3 mg dexamethasone Kenalog 5 mg Xylocaine and then debrided the 2 lesions fully and discussed ultimate surgical intervention consisting of elevating osteotomy removal of plantar skin wedge and removal of tarsal exostosis. I absolutely explaining there is no guarantee that this would solve his problem but would probably give him best chance and long-term relief. He wants to do upper wants to wait until February

## 2014-11-15 NOTE — Patient Instructions (Signed)
Pre-Operative Instructions  Congratulations, you have decided to take an important step to improving your quality of life.  You can be assured that the doctors of [facility] will be with you every step of the way.  1. Plan to be at the surgery center/hospital at least 1 (one) hour prior to your scheduled time unless otherwise directed by the surgical center/hospital staff.  You must have a responsible adult accompany you, remain during the surgery and drive you home.  Make sure you have directions to the surgical center/hospital and know how to get there on time. 2. For hospital based surgery you will need to obtain a history and physical form from your family physician within 1 month prior to the date of surgery- we will give you a form for you primary physician.  3. We make every effort to accommodate the date you request for surgery.  There are however, times where surgery dates or times have to be moved.  We will contact you as soon as possible if a change in schedule is required.   4. No Aspirin/Ibuprofen for one week before surgery.  If you are on aspirin, any non-steroidal anti-inflammatory medications (Mobic, Aleve, Ibuprofen) you should stop taking it 7 days prior to your surgery.  You make take Tylenol  For pain prior to surgery.  5. Medications- If you are taking daily heart and blood pressure medications, seizure, reflux, allergy, asthma, anxiety, pain or diabetes medications, make sure the surgery center/hospital is aware before the day of surgery so they may notify you which medications to take or avoid the day of surgery. 6. No food or drink after midnight the night before surgery unless directed otherwise by surgical center/hospital staff. 7. No alcoholic beverages 24 hours prior to surgery.  No smoking 24 hours prior to or 24 hours after surgery. 8. Wear loose pants or shorts- loose enough to fit over bandages, boots, and casts. 9. No slip on shoes, sneakers are best. 10. Bring  your boot with you to the surgery center/hospital.  Also bring crutches or a walker if your physician has prescribed it for you.  If you do not have this equipment, it will be provided for you after surgery. 11. If you have not been contracted by the surgery center/hospital by the day before your surgery, call to confirm the date and time of your surgery. 12. Leave-time from work may vary depending on the type of surgery you have.  Appropriate arrangements should be made prior to surgery with your employer. 13. Prescriptions will be provided immediately following surgery by your doctor.  Have these filled as soon as possible after surgery and take the medication as directed. 14. Remove nail polish on the operative foot. 15. Wash the night before surgery.  The night before surgery wash the foot and leg well with the antibacterial soap provided and water paying special attention to beneath the toenails and in between the toes.  Rinse thoroughly with water and dry well with a towel.  Perform this wash unless told not to do so by your physician.  Enclosed: 1 Ice pack (please put in freezer the night before surgery)   1 Hibiclens skin cleaner   Pre-op Instructions  If you have any questions regarding the instructions, do not hesitate to call our office.  [place]: [address] [place], [place] [address] [phone]  [place]: [address] [place], [place] [address] [phone]  [place]: [address]  [place], [place] [address] [phone]  Dr. Richard   Tuchman DPM, Dr. Maronda Caison DPM Dr. Richard Sikora DPM, Dr. M. Todd Hyatt DPM, Dr. Kathryn Egerton DPM 

## 2015-01-07 DIAGNOSIS — M109 Gout, unspecified: Secondary | ICD-10-CM | POA: Diagnosis not present

## 2015-01-28 DIAGNOSIS — E780 Pure hypercholesterolemia: Secondary | ICD-10-CM | POA: Diagnosis not present

## 2015-01-28 DIAGNOSIS — I10 Essential (primary) hypertension: Secondary | ICD-10-CM | POA: Diagnosis not present

## 2015-01-28 DIAGNOSIS — G4733 Obstructive sleep apnea (adult) (pediatric): Secondary | ICD-10-CM | POA: Diagnosis not present

## 2015-01-28 DIAGNOSIS — Z23 Encounter for immunization: Secondary | ICD-10-CM | POA: Diagnosis not present

## 2015-01-28 DIAGNOSIS — I4891 Unspecified atrial fibrillation: Secondary | ICD-10-CM | POA: Diagnosis not present

## 2015-01-28 DIAGNOSIS — Z1389 Encounter for screening for other disorder: Secondary | ICD-10-CM | POA: Diagnosis not present

## 2015-01-28 DIAGNOSIS — I2510 Atherosclerotic heart disease of native coronary artery without angina pectoris: Secondary | ICD-10-CM | POA: Diagnosis not present

## 2015-01-28 DIAGNOSIS — Z0000 Encounter for general adult medical examination without abnormal findings: Secondary | ICD-10-CM | POA: Diagnosis not present

## 2015-01-28 DIAGNOSIS — E782 Mixed hyperlipidemia: Secondary | ICD-10-CM | POA: Diagnosis not present

## 2015-01-28 DIAGNOSIS — J309 Allergic rhinitis, unspecified: Secondary | ICD-10-CM | POA: Diagnosis not present

## 2015-01-28 DIAGNOSIS — N400 Enlarged prostate without lower urinary tract symptoms: Secondary | ICD-10-CM | POA: Diagnosis not present

## 2015-01-28 DIAGNOSIS — K219 Gastro-esophageal reflux disease without esophagitis: Secondary | ICD-10-CM | POA: Diagnosis not present

## 2015-02-07 ENCOUNTER — Ambulatory Visit (INDEPENDENT_AMBULATORY_CARE_PROVIDER_SITE_OTHER): Payer: Medicare Other | Admitting: Physician Assistant

## 2015-02-07 ENCOUNTER — Encounter: Payer: Self-pay | Admitting: Physician Assistant

## 2015-02-07 VITALS — BP 134/78 | HR 78 | Ht 70.0 in | Wt 245.0 lb

## 2015-02-07 DIAGNOSIS — Z9861 Coronary angioplasty status: Secondary | ICD-10-CM

## 2015-02-07 DIAGNOSIS — E785 Hyperlipidemia, unspecified: Secondary | ICD-10-CM

## 2015-02-07 DIAGNOSIS — I10 Essential (primary) hypertension: Secondary | ICD-10-CM

## 2015-02-07 DIAGNOSIS — I2510 Atherosclerotic heart disease of native coronary artery without angina pectoris: Secondary | ICD-10-CM

## 2015-02-07 DIAGNOSIS — I4891 Unspecified atrial fibrillation: Secondary | ICD-10-CM | POA: Diagnosis not present

## 2015-02-07 DIAGNOSIS — E669 Obesity, unspecified: Secondary | ICD-10-CM

## 2015-02-07 MED ORDER — APIXABAN 5 MG PO TABS: 5.0000 mg | ORAL_TABLET | Freq: Two times a day (BID) | ORAL | Status: DC

## 2015-02-07 NOTE — Progress Notes (Signed)
Cardiology Office Note Date:  02/07/2015  Patient ID:  Christopher Bradford, Christopher Bradford January 21, 1951, MRN [id number] PCP:  [name], MD  Cardiologist:  [name]   Chief Complaint: evaluation of atrial fib  History of Present Illness: Christopher Bradford is a 64 y.o. male with history of CAD s/p stent to Cx 1998 (patent by [facility] 2005 with otherwise mild luminal irreg, normal EF at that time), HLD, essential HTN, obesity, osteoarthritis, sleep apnea, depression, GERD who presents for evaluation of recently diagnosed atrial fib. He had presented to his PCP for a physical on 01/28/15 and was incidentally noted to be in rate-controlled atrial fibrillation. Labs revealed WBC 6.7, Hgb 14.7, Plt 187, Na 142, K 4.1, Cl 106, CO2 29, BUN 10, Cr 0.81, glucose 82, calcium 9.9, TProt 6.6, albumin 4.5, AST/ALT 14/15, TSH 1.01, trig 70, HDL 50, LDL 66. Aspirin was discontinued and he was started on Eliquis 5mg  BID (CHADSVASC = 3).   He comes in to clinic today for evaluation of this. He remains completely asymptomatic - no chest pain, SOB, palpitations or awareness of atrial fib. Last EKG was 03/2014 and NSR so no way to know how long he's actually been in this. He does report a lot of stress with work and caring for his wife who is not in good health. He started Eliquis on 01/30/15 but does admit he has forgotten the evening dose on 2 occasions. He reports compliance with CPAP. No change in exercise tolerance.    Past Medical History  Diagnosis Date  . CAD (coronary artery disease)     a. s/p stent to Cx 1998. b. Stent (patent by [facility] 2005 with otherwise mild luminal irreg, normal EF at that time.  . Hypertension   . History of gout   . Hyperlipidemia   . Depression   . OSA (obstructive sleep apnea)   . GERD (gastroesophageal reflux disease)   . Osteoarthritis   . BPH (benign prostatic hyperplasia)   . Anxiety   . Hyperplastic colon polyp   . Obesity   . Atrial fibrillation     Past Surgical History    Procedure Laterality Date  . Tonsillectomy and adenoidectomy      age 68  . Cardiac catheterization  1998    stent to left circumflex  . Cardiac catheterization  2005    stent widely patent  . Carpal tunnel release    . Knee arthroscopy Left   . Shoulder surgery    . Back surgery    . Colonoscopy      Current Outpatient Prescriptions  Medication Sig Dispense Refill  . amLODipine (NORVASC) 2.5 MG tablet Take 2.5 mg by mouth daily.      Marland Kitchen aspirin 81 MG tablet Take 81 mg by mouth daily.      Marland Kitchen azelastine (ASTELIN) 137 MCG/SPRAY nasal spray Place 1 spray into the nose 2 (two) times daily.     . benazepril (LOTENSIN) 40 MG tablet Take 40 mg by mouth daily.      . cetirizine (ZYRTEC) 5 MG tablet Take 5 mg by mouth daily.      . Coenzyme Q10 (CO Q 10 PO) Take 1 capsule by mouth daily.     Marland Kitchen COLCRYS 0.6 MG tablet Take 1 tablet by mouth 2 (two) times daily as needed (gout).   3  . diazepam (VALIUM) 2 MG tablet Take 2 mg by mouth daily as needed (sleep).     . DULoxetine (CYMBALTA) 20 MG capsule Take  20 mg by mouth daily.  3  . Multiple Vitamin (MULTIVITAMIN) tablet Take 1 tablet by mouth daily.      . Omega-3 Fatty Acids (FISH OIL PO) Take 1 capsule by mouth daily.     . Omeprazole (PRILOSEC PO) Take 1 capsule by mouth daily.     . simvastatin (ZOCOR) 40 MG tablet Take 40 mg by mouth at bedtime.      . traMADol (ULTRAM) 50 MG tablet Take 50 mg by mouth every 12 (twelve) hours as needed (pain).   0   No current facility-administered medications for this visit.    Allergies:   Penicillins   Social History:  The patient  reports that he has quit smoking. His smoking use included Cigarettes. He does not have any smokeless tobacco history on file.   Family History:  The patient's family history includes Alcohol abuse in his sister; Cervical cancer in his sister; Dementia in his mother; Hypertension in his father; Prostate cancer in his father; Suicidality in his father.  ROS:  Please see  the history of present illness.  All other systems are reviewed and otherwise negative.   PHYSICAL EXAM: VS:  BP 134/78 mmHg  Pulse 78  Ht 5\' 10"  (1.778 m)  Wt 245 lb (111.131 kg)  BMI 35.15 kg/m2 BMI: Body mass index is 35.15 kg/(m^2). Well nourished, well developed obese WM, in no acute distress HEENT: normocephalic, atraumatic Neck: no JVD, carotid bruits or masses Cardiac:  Irregularly irregular, rate controlled; no murmurs, rubs, or gallops Lungs:  clear to auscultation bilaterally, no wheezing, rhonchi or rales Abd: soft, nontender, no hepatomegaly, + BS MS: no deformity or atrophy Ext: no edema Skin: warm and dry, no rash Neuro:  moves all extremities spontaneously, no focal abnormalities noted, follows commands Psych: euthymic mood, full affect   EKG:  Done today shows atrial fibrillation 78bpm, no acute ST-T changes  Recent Labs: See above.   Wt Readings from Last 3 Encounters:  02/07/15 245 lb (111.131 kg)  03/21/14 245 lb (111.131 kg)  02/08/14 230 lb (104.327 kg)     Other studies reviewed: Additional studies/records reviewed today include: summarized above  ASSESSMENT AND PLAN:  1. Atrial fib - newly recognized, unknown duration, asymptomatic. Excellent preliminary workup done by PCP. He has been appropriately started on Eliquis for CHADSVASC 3. He is actually rate controlled at baseline which may indicate some underlying conduction disease that will need to be followed given normal HR despite no AVN blocking agents. We discussed importance of compliance with Eliquis. I offered to switch to Xarelto but this is typically taken qsupper - he declined and says he just has to start remembering his evening dose by setting up reminders. Will check 2D echocardiogram. If echo stable, will discuss possibility of attempt at DCCV after 3-4 weeks of uninterrupted anticoagulation. (If DCCV did not hold I would not pursue rhythm control at this time since he is asymptomatic.) If  2D echo is abnormal he may need updated ischemic testing. I will plan to discuss with [name] when we get those results. The patient only has samples of Eliquis - we will refill. Bleeding precautions reviewed. 2. CAD s/p PCI - no recent angina. See above re: ischemic eval. 3. Essential HTN - continue current regimen. 4. Hyperlipidemia - lipids followed by PCP. 5. Obesity with OSA - compliant with CPAP. We discussed importance of staying active to reduce weight and risk factors.  Disposition: F/u with [name] or APP in 3-4 weeks.  Current medicines are reviewed at length with the patient today.  The patient did not have any concerns regarding medicines.  Raechel Ache PA-C 02/07/2015 3:29 PM     [place] [address] [phone] (office)  [phone] (fax)

## 2015-02-07 NOTE — Patient Instructions (Addendum)
Medication Instructions:  Your physician recommends that you continue on your current medications as directed. Please refer to the Current Medication list given to you today.   Labwork: NONE ORDERED  Testing/Procedures: Your physician has requested that you have an echocardiogram. Echocardiography is a painless test that uses sound waves to create images of your heart. It provides your doctor with information about the size and shape of your heart and how well your heart's chambers and valves are working. This procedure takes approximately one hour. There are no restrictions for this procedure.    Follow-Up: Your physician recommends that you schedule a follow-up appointment in: 3-4 WEEKS WITH [name] OR DAYNA DUNN, PA-C ON DAY DR. Meridee Score IS IN OFFICE.   Any Other Special Instructions Will Be Listed Below (If Applicable).  Echocardiogram An echocardiogram, or echocardiography, uses sound waves (ultrasound) to produce an image of your heart. The echocardiogram is simple, painless, obtained within a short period of time, and offers valuable information to your health care provider. The images from an echocardiogram can provide information such as:  Evidence of coronary artery disease (CAD).  Heart size.  Heart muscle function.  Heart valve function.  Aneurysm detection.  Evidence of a past heart attack.  Fluid buildup around the heart.  Heart muscle thickening.  Assess heart valve function. LET [facility] CARE PROVIDER KNOW ABOUT:  Any allergies you have.  All medicines you are taking, including vitamins, herbs, eye drops, creams, and over-the-counter medicines.  Previous problems you or members of your family have had with the use of anesthetics.  Any blood disorders you have.  Previous surgeries you have had.  Medical conditions you have.  Possibility of pregnancy, if this applies. BEFORE THE PROCEDURE  No special preparation is needed. Eat and drink  normally.  PROCEDURE   In order to produce an image of your heart, gel will be applied to your chest and a wand-like tool (transducer) will be moved over your chest. The gel will help transmit the sound waves from the transducer. The sound waves will harmlessly bounce off your heart to allow the heart images to be captured in real-time motion. These images will then be recorded.  You may need an IV to receive a medicine that improves the quality of the pictures. AFTER THE PROCEDURE You may return to your normal schedule including diet, activities, and medicines, unless your health care provider tells you otherwise. Document Released: 05/01/2000 Document Revised: 09/18/2013 Document Reviewed: 01/09/2013 [facility] Patient Information 2015 [place], [place]. This information is not intended to replace advice given to you by your health care provider. Make sure you discuss any questions you have with your health care provider.

## 2015-02-14 ENCOUNTER — Other Ambulatory Visit: Payer: Self-pay

## 2015-02-14 ENCOUNTER — Ambulatory Visit (HOSPITAL_COMMUNITY): Payer: Medicare Other | Attending: Physician Assistant

## 2015-02-14 DIAGNOSIS — I340 Nonrheumatic mitral (valve) insufficiency: Secondary | ICD-10-CM | POA: Diagnosis not present

## 2015-02-14 DIAGNOSIS — I358 Other nonrheumatic aortic valve disorders: Secondary | ICD-10-CM | POA: Diagnosis not present

## 2015-02-14 DIAGNOSIS — Z9861 Coronary angioplasty status: Secondary | ICD-10-CM | POA: Diagnosis not present

## 2015-02-14 DIAGNOSIS — I2510 Atherosclerotic heart disease of native coronary artery without angina pectoris: Secondary | ICD-10-CM | POA: Diagnosis not present

## 2015-02-14 DIAGNOSIS — M109 Gout, unspecified: Secondary | ICD-10-CM | POA: Insufficient documentation

## 2015-02-14 DIAGNOSIS — I10 Essential (primary) hypertension: Secondary | ICD-10-CM | POA: Diagnosis not present

## 2015-02-14 DIAGNOSIS — Z87891 Personal history of nicotine dependence: Secondary | ICD-10-CM | POA: Insufficient documentation

## 2015-02-14 DIAGNOSIS — E785 Hyperlipidemia, unspecified: Secondary | ICD-10-CM | POA: Diagnosis not present

## 2015-02-14 DIAGNOSIS — I517 Cardiomegaly: Secondary | ICD-10-CM | POA: Diagnosis not present

## 2015-03-04 DIAGNOSIS — F419 Anxiety disorder, unspecified: Secondary | ICD-10-CM | POA: Diagnosis not present

## 2015-03-04 DIAGNOSIS — M25569 Pain in unspecified knee: Secondary | ICD-10-CM | POA: Diagnosis not present

## 2015-03-04 DIAGNOSIS — I4891 Unspecified atrial fibrillation: Secondary | ICD-10-CM | POA: Diagnosis not present

## 2015-03-13 ENCOUNTER — Ambulatory Visit: Payer: Medicare Other | Admitting: Cardiology

## 2015-03-28 ENCOUNTER — Encounter: Payer: Self-pay | Admitting: Cardiology

## 2015-03-28 ENCOUNTER — Ambulatory Visit (INDEPENDENT_AMBULATORY_CARE_PROVIDER_SITE_OTHER): Payer: Medicare Other | Admitting: Cardiology

## 2015-03-28 VITALS — BP 142/78 | HR 88 | Ht 69.0 in | Wt 236.8 lb

## 2015-03-28 DIAGNOSIS — I482 Chronic atrial fibrillation, unspecified: Secondary | ICD-10-CM

## 2015-03-28 DIAGNOSIS — I4891 Unspecified atrial fibrillation: Secondary | ICD-10-CM

## 2015-03-28 DIAGNOSIS — I2510 Atherosclerotic heart disease of native coronary artery without angina pectoris: Secondary | ICD-10-CM

## 2015-03-28 DIAGNOSIS — Z9861 Coronary angioplasty status: Secondary | ICD-10-CM

## 2015-03-28 NOTE — Patient Instructions (Signed)
Medication Instructions:  Your physician recommends that you continue on your current medications as directed. Please refer to the Current Medication list given to you today.  Labwork: NONE  Testing/Procedures: NONE  Follow-Up: Your physician recommends that you schedule a follow-up appointment in: 4 MONTH OV/EKG WITH DR Martin[place]  If you need a refill on your cardiac medications before your next appointment, please call your pharmacy.

## 2015-03-28 NOTE — Progress Notes (Signed)
Cardiology Office Note   Date:  03/28/2015   ID:  Christopher Bradford, DOB May 28, 1950, MRN [id number]  PCP:  Christopher Low, MD  Cardiologist: Christopher Coco MD  Chief Complaint  Patient presents with  . Atrial Fibrillation      History of Present Illness: Christopher Bradford is a 64 y.o. male who presents for follow-up office visit   He is a medical patient of Christopher Bradford. The patient has a past history of ischemic heart disease. He has a past history of a stent to the circumflex in 1998. He had a cardiac catheterization in 2005 showing that the left circumflex stent was widely patent. Patient is on statin therapy monitored by his PCP. The patient has a history of hypercholesterolemia and essential hypertension and exogenous obesity. The patient has had a lot of stress in the past year regarding a job change.He is also the caregiver for his wife who has a lot of chronic medical problems.  The patient was recently found to be in atrial fibrillation which was asymptomatic.He had presented to his PCP for a physical on 01/28/15 and was incidentally noted to be in rate-controlled atrial fibrillation. Labs revealed WBC 6.7, Hgb 14.7, Plt 187, Na 142, K 4.1, Cl 106, CO2 29, BUN 10, Cr 0.81, glucose 82, calcium 9.9, TProt 6.6, albumin 4.5, AST/ALT 14/15, TSH 1.01, trig 70, HDL 50, LDL 66. Aspirin was discontinued and he was started on Eliquis 5mg  BID (CHADSVASC = 3).  His heart rate was already well controlled and he did not require any additional A-V node blocking medication.  He has been compliant with his Apixaban.  He has not had any stroke or TIA symptoms.  Past Medical History  Diagnosis Date  . CAD (coronary artery disease)     a. s/p stent to Cx 1998. b. Stent (patent by [facility] 2005 with otherwise mild luminal irreg, normal EF at that time.  . Hypertension   . History of gout   . Hyperlipidemia   . Depression   . OSA (obstructive sleep apnea)   . GERD (gastroesophageal reflux  disease)   . Osteoarthritis   . BPH (benign prostatic hyperplasia)   . Anxiety   . Hyperplastic colon polyp   . Obesity   . Atrial fibrillation [facility])     Past Surgical History  Procedure Laterality Date  . Tonsillectomy and adenoidectomy      age 21  . Cardiac catheterization  1998    stent to left circumflex  . Cardiac catheterization  2005    stent widely patent  . Carpal tunnel release    . Knee arthroscopy Left   . Shoulder surgery    . Back surgery    . Colonoscopy       Current Outpatient Prescriptions  Medication Sig Dispense Refill  . amLODipine (NORVASC) 2.5 MG tablet Take 2.5 mg by mouth daily.      Marland Kitchen apixaban (ELIQUIS) 5 MG TABS tablet Take 1 tablet (5 mg total) by mouth 2 (two) times daily. 180 tablet 3  . azelastine (ASTELIN) 137 MCG/SPRAY nasal spray Place 1 spray into the nose 2 (two) times daily.     . benazepril (LOTENSIN) 40 MG tablet Take 40 mg by mouth daily.      . busPIRone (BUSPAR) 5 MG tablet Take 5 mg by mouth 2 (two) times daily.  6  . cetirizine (ZYRTEC) 5 MG tablet Take 5 mg by mouth daily.      . Coenzyme Q10 (CO  Q 10 PO) Take 1 capsule by mouth daily.     Marland Kitchen COLCRYS 0.6 MG tablet Take 1 tablet by mouth 2 (two) times daily as needed (gout).   3  . diazepam (VALIUM) 2 MG tablet Take 2 mg by mouth daily as needed (sleep).     . Omega-3 Fatty Acids (FISH OIL PO) Take 1 capsule by mouth daily.     . Omeprazole (PRILOSEC PO) Take 1 capsule by mouth daily.     . simvastatin (ZOCOR) 40 MG tablet Take 40 mg by mouth at bedtime.      . traMADol (ULTRAM) 50 MG tablet Take 50 mg by mouth every 12 (twelve) hours as needed (pain).   0   No current facility-administered medications for this visit.    Allergies:   Penicillins    Social History:  The patient  reports that he has quit smoking. His smoking use included Cigarettes. He does not have any smokeless tobacco history on file.   Family History:  The patient's family history includes Alcohol abuse in  his sister; Cervical cancer in his sister; Dementia in his mother; Hypertension in his father; Prostate cancer in his father; Suicidality in his father.    ROS:  Please see the history of present illness.   Otherwise, review of systems are positive for none.   All other systems are reviewed and negative.    PHYSICAL EXAM: VS:  BP 142/78 mmHg  Pulse 88  Ht 5\' 9"  (1.753 m)  Wt 236 lb 12.8 oz (107.412 kg)  BMI 34.95 kg/m2 , BMI Body mass index is 34.95 kg/(m^2). GEN: Well nourished, well developed, in no acute distress HEENT: normal Neck: no JVD, carotid bruits, or masses Cardiac: Irregularly irregular; no murmurs, rubs, or gallops,no edema  Respiratory:  clear to auscultation bilaterally, normal work of breathing GI: soft, nontender, nondistended, + BS MS: no deformity or atrophy Skin: warm and dry, no rash Neuro:  Strength and sensation are intact Psych: euthymic mood, full affect   EKG:  EKG is ordered today. The ekg ordered today demonstrates atrial fibrillation with controlled ventricular response of 88 bpm otherwise normal   Recent Labs: No results found for requested labs within last 365 days.    Lipid Panel No results found for: CHOL, TRIG, HDL, CHOLHDL, VLDL, LDLCALC, LDLDIRECT    Wt Readings from Last 3 Encounters:  03/28/15 236 lb 12.8 oz (107.412 kg)  02/07/15 245 lb (111.131 kg)  03/21/14 245 lb (111.131 kg)        ASSESSMENT AND PLAN:  1. Ischemic heart disease with previous stent to circumflex coronary artery in 1998. Cardiac catheterization in 2005 showed that the stent was widely patent. 2. Essential hypertension 3. Obesity 4. Hypercholesterolemia 5. Osteoarthritis of both knees 6.  Established atrial fibrillation Disposition: Continue current medication. I encouraged him to get back on a careful diet and to lose weight and to increase his walking exercise. He states he is unable to do much walking because of his knee arthritis. He is able to  swim and that is his best exercise activity.  We discussed options concerning his atrial fibrillation.  One option would be to go ahead and do a outpatient cardioversion.  However his wife has told him that she would not 1 him to have the cardioversion.  Moreover chances of long-term success with holding him in normal sinus rhythm are diminished by the fact that his echocardiogram had shown severe left atrial enlargement, presumably from his long-standing high  blood pressure.  For this reason we decided not to pursue cardioversion with 2 continue with rate control and long-term anticoagulation Recheck in one year for office visit and EKG   Current medicines are reviewed at length with the patient today.  The patient does not have concerns regarding medicines.  The following changes have been made:  no change  Labs/ tests ordered today include:   Orders Placed This Encounter  Procedures  . EKG 12-Lead     Disposition: Continue current medication including Apixaban for his chronic atrial fibrillation.  Recheck in 4 months for office visit and EKG with Dr. [name]  Signed, Christopher Coco MD 03/28/2015 1:43 PM    [address], [place], [place]  [address] Phone: [phone]; Fax: [phone]

## 2015-04-24 DIAGNOSIS — M1711 Unilateral primary osteoarthritis, right knee: Secondary | ICD-10-CM | POA: Diagnosis not present

## 2015-04-24 DIAGNOSIS — M170 Bilateral primary osteoarthritis of knee: Secondary | ICD-10-CM | POA: Diagnosis not present

## 2015-04-24 DIAGNOSIS — M1712 Unilateral primary osteoarthritis, left knee: Secondary | ICD-10-CM | POA: Diagnosis not present

## 2015-05-02 DIAGNOSIS — M549 Dorsalgia, unspecified: Secondary | ICD-10-CM | POA: Diagnosis not present

## 2015-05-22 DIAGNOSIS — K5289 Other specified noninfective gastroenteritis and colitis: Secondary | ICD-10-CM | POA: Diagnosis not present

## 2015-06-13 DIAGNOSIS — G4733 Obstructive sleep apnea (adult) (pediatric): Secondary | ICD-10-CM | POA: Diagnosis not present

## 2015-07-22 ENCOUNTER — Ambulatory Visit (INDEPENDENT_AMBULATORY_CARE_PROVIDER_SITE_OTHER): Payer: Medicare Other | Admitting: Cardiology

## 2015-07-22 ENCOUNTER — Encounter: Payer: Self-pay | Admitting: Cardiology

## 2015-07-22 VITALS — BP 132/80 | HR 84 | Ht 69.0 in | Wt 244.3 lb

## 2015-07-22 DIAGNOSIS — I259 Chronic ischemic heart disease, unspecified: Secondary | ICD-10-CM

## 2015-07-22 DIAGNOSIS — I482 Chronic atrial fibrillation, unspecified: Secondary | ICD-10-CM

## 2015-07-22 DIAGNOSIS — I10 Essential (primary) hypertension: Secondary | ICD-10-CM | POA: Diagnosis not present

## 2015-07-22 DIAGNOSIS — E785 Hyperlipidemia, unspecified: Secondary | ICD-10-CM

## 2015-07-22 NOTE — Patient Instructions (Signed)
Continue your current therapy  I will see you in 6 months.   

## 2015-07-22 NOTE — Progress Notes (Signed)
Cardiology Office Note   Date:  07/22/2015   ID:  [name], DOB March 16, 1951, MRN [id number]  PCP:  Christopher Low, MD  Cardiologist: Darlin Coco MD  No chief complaint on file.     History of Present Illness: Christopher Bradford is a 65 y.o. male who presents for follow-up CAD and atrial fibrillation.    He is formerly a patient of [name].  The patient has a past history of ischemic heart disease. He has a past history of a stent to the circumflex in 1998. He had a cardiac catheterization in 2005 showing that the left circumflex stent was widely patent. Patient is on statin therapy. The patient has a history of hypercholesterolemia and essential hypertension and exogenous obesity. He is also the primary caregiver for his wife who has a lot of chronic medical problems.  In September 2016 he was found to be in atrial fibrillation which was asymptomatic.He had presented to his PCP for a physical on 01/28/15 and was incidentally noted to be in rate-controlled atrial fibrillation.  Aspirin was discontinued and he was started on Eliquis 5mg  BID (CHADSVASC = 3).  His heart rate was already well controlled and he did not require any additional A-V node blocking medication. He has not had any stroke or TIA symptoms. Echo 02/14/15 showed normal LV function. Valvular function was good. The LA was severely enlarged and the RA was moderately enlarged.  On follow up today he is doing well. Exercise is limited by severe arthritis. States he needs bilateral TKR. 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 He still denies any chest pain, SOB, dizziness, fatigue or palpitations.   Past Medical History  Diagnosis Date  . CAD (coronary artery disease)     a. s/p stent to Cx 1998. b. Stent (patent by [facility] 2005 with otherwise mild luminal irreg, normal EF at that time.  . Hypertension   . History of gout   . Hyperlipidemia   . Depression   . OSA (obstructive sleep apnea)   . GERD  (gastroesophageal reflux disease)   . Osteoarthritis   . BPH (benign prostatic hyperplasia)   . Anxiety   . Hyperplastic colon polyp   . Obesity   . Atrial fibrillation [facility])     Past Surgical History  Procedure Laterality Date  . Tonsillectomy and adenoidectomy      age 12  . Cardiac catheterization  1998    stent to left circumflex  . Cardiac catheterization  2005    stent widely patent  . Carpal tunnel release    . Knee arthroscopy Left   . Shoulder surgery    . Back surgery    . Colonoscopy       Current Outpatient Prescriptions  Medication Sig Dispense Refill  . amLODipine (NORVASC) 2.5 MG tablet Take 2.5 mg by mouth daily.      Marland Kitchen apixaban (ELIQUIS) 5 MG TABS tablet Take 1 tablet (5 mg total) by mouth 2 (two) times daily. 180 tablet 3  . azelastine (ASTELIN) 137 MCG/SPRAY nasal spray Place 1 spray into the nose 2 (two) times daily.     . benazepril (LOTENSIN) 40 MG tablet Take 40 mg by mouth daily.      . busPIRone (BUSPAR) 5 MG tablet Take 5 mg by mouth 2 (two) times daily.  6  . cetirizine (ZYRTEC) 5 MG tablet Take 5 mg by mouth daily.      . Coenzyme Q10 (CO Q 10  PO) Take 1 capsule by mouth daily.     Marland Kitchen COLCRYS 0.6 MG tablet Take 1 tablet by mouth 2 (two) times daily as needed (gout).   3  . diazepam (VALIUM) 2 MG tablet Take 2 mg by mouth daily as needed (sleep).     . Omega-3 Fatty Acids (FISH OIL PO) Take 1 capsule by mouth daily.     . Omeprazole (PRILOSEC PO) Take 1 capsule by mouth daily.     . simvastatin (ZOCOR) 40 MG tablet Take 40 mg by mouth at bedtime.      . traMADol (ULTRAM) 50 MG tablet Take 50 mg by mouth every 12 (twelve) hours as needed (pain).   0   No current facility-administered medications for this visit.    Allergies:   Penicillins    Social History:  The patient  reports that he has quit smoking. His smoking use included Cigarettes. He does not have any smokeless tobacco history on file.   Family History:  The patient's family history  includes Alcohol abuse in his sister; Cervical cancer in his sister; Dementia in his mother; Hypertension in his father; Prostate cancer in his father; Suicidality in his father.    ROS:  Please see the history of present illness.   Otherwise, review of systems are positive for none.   All other systems are reviewed and negative.    PHYSICAL EXAM: VS:  There were no vitals taken for this visit. , BMI There is no weight on file to calculate BMI. GEN: Well nourished, overweight, in no acute distress HEENT: normal Neck: no JVD, carotid bruits, or masses Cardiac: Irregularly irregular; no murmurs, rubs, or gallops,no edema  Respiratory:  clear to auscultation bilaterally, normal work of breathing GI: soft, nontender, nondistended, + BS MS: no deformity or atrophy Skin: warm and dry, no rash Neuro:  Strength and sensation are intact Psych: euthymic mood, full affect   EKG:  EKG is not ordered today. The ekg ordered today N/A   Recent Labs: No results found for requested labs within last 365 days.    Lipid Panel No results found for: CHOL, TRIG, HDL, CHOLHDL, VLDL, LDLCALC, LDLDIRECT    Wt Readings from Last 3 Encounters:  03/28/15 107.412 kg (236 lb 12.8 oz)  02/07/15 111.131 kg (245 lb)  03/21/14 111.131 kg (245 lb)        ASSESSMENT AND PLAN:  1. Ischemic heart disease with previous stent to circumflex coronary artery in 1998. Cardiac catheterization in 2005 showed that the stent was widely patent. No anginal symptoms.  2. Essential hypertension- controlled.  3. Obesity 4. Hypercholesterolemia 5. Osteoarthritis of both knees 6.  Permanent  atrial fibrillation- rate is well controlled on no AV nodal blocking agents. He is asymptomatic. Appropriately anticoagulated with Eliquis.   Disposition: Continue current medication.follow up in 6 months.   Current medicines are reviewed at length with the patient today.  The patient does not have concerns regarding  medicines.  The following changes have been made:  no change  Labs/ tests ordered today include:   No orders of the defined types were placed in this encounter.     Disposition: Follow up in 6 months.  Signed, [name] MD, [facility]  07/22/2015 7:33 AM

## 2015-09-02 DIAGNOSIS — E782 Mixed hyperlipidemia: Secondary | ICD-10-CM | POA: Diagnosis not present

## 2015-09-02 DIAGNOSIS — I10 Essential (primary) hypertension: Secondary | ICD-10-CM | POA: Diagnosis not present

## 2015-09-02 DIAGNOSIS — I4891 Unspecified atrial fibrillation: Secondary | ICD-10-CM | POA: Diagnosis not present

## 2015-09-02 DIAGNOSIS — K5289 Other specified noninfective gastroenteritis and colitis: Secondary | ICD-10-CM | POA: Diagnosis not present

## 2015-09-02 DIAGNOSIS — F419 Anxiety disorder, unspecified: Secondary | ICD-10-CM | POA: Diagnosis not present

## 2015-09-02 DIAGNOSIS — G4733 Obstructive sleep apnea (adult) (pediatric): Secondary | ICD-10-CM | POA: Diagnosis not present

## 2015-10-10 DIAGNOSIS — H25813 Combined forms of age-related cataract, bilateral: Secondary | ICD-10-CM | POA: Diagnosis not present

## 2015-10-10 DIAGNOSIS — H1859 Other hereditary corneal dystrophies: Secondary | ICD-10-CM | POA: Diagnosis not present

## 2015-10-16 DIAGNOSIS — H18833 Recurrent erosion of cornea, bilateral: Secondary | ICD-10-CM | POA: Diagnosis not present

## 2015-10-16 DIAGNOSIS — H01025 Squamous blepharitis left lower eyelid: Secondary | ICD-10-CM | POA: Diagnosis not present

## 2015-10-16 DIAGNOSIS — H01022 Squamous blepharitis right lower eyelid: Secondary | ICD-10-CM | POA: Diagnosis not present

## 2015-10-16 DIAGNOSIS — H16223 Keratoconjunctivitis sicca, not specified as Sjogren's, bilateral: Secondary | ICD-10-CM | POA: Diagnosis not present

## 2015-10-16 DIAGNOSIS — H01021 Squamous blepharitis right upper eyelid: Secondary | ICD-10-CM | POA: Diagnosis not present

## 2015-10-16 DIAGNOSIS — H01024 Squamous blepharitis left upper eyelid: Secondary | ICD-10-CM | POA: Diagnosis not present

## 2015-11-13 DIAGNOSIS — H01021 Squamous blepharitis right upper eyelid: Secondary | ICD-10-CM | POA: Diagnosis not present

## 2015-11-13 DIAGNOSIS — H01024 Squamous blepharitis left upper eyelid: Secondary | ICD-10-CM | POA: Diagnosis not present

## 2015-11-13 DIAGNOSIS — H01022 Squamous blepharitis right lower eyelid: Secondary | ICD-10-CM | POA: Diagnosis not present

## 2015-11-13 DIAGNOSIS — H10413 Chronic giant papillary conjunctivitis, bilateral: Secondary | ICD-10-CM | POA: Diagnosis not present

## 2015-11-13 DIAGNOSIS — H04123 Dry eye syndrome of bilateral lacrimal glands: Secondary | ICD-10-CM | POA: Diagnosis not present

## 2015-11-13 DIAGNOSIS — H01025 Squamous blepharitis left lower eyelid: Secondary | ICD-10-CM | POA: Diagnosis not present

## 2015-11-13 DIAGNOSIS — H1859 Other hereditary corneal dystrophies: Secondary | ICD-10-CM | POA: Diagnosis not present

## 2015-11-13 DIAGNOSIS — H18833 Recurrent erosion of cornea, bilateral: Secondary | ICD-10-CM | POA: Diagnosis not present

## 2016-01-22 ENCOUNTER — Other Ambulatory Visit: Payer: Self-pay | Admitting: Physician Assistant

## 2016-01-29 DIAGNOSIS — Z87891 Personal history of nicotine dependence: Secondary | ICD-10-CM | POA: Diagnosis not present

## 2016-01-29 DIAGNOSIS — G4733 Obstructive sleep apnea (adult) (pediatric): Secondary | ICD-10-CM | POA: Diagnosis not present

## 2016-01-29 DIAGNOSIS — K5289 Other specified noninfective gastroenteritis and colitis: Secondary | ICD-10-CM | POA: Diagnosis not present

## 2016-01-29 DIAGNOSIS — I4891 Unspecified atrial fibrillation: Secondary | ICD-10-CM | POA: Diagnosis not present

## 2016-01-29 DIAGNOSIS — Z6834 Body mass index (BMI) 34.0-34.9, adult: Secondary | ICD-10-CM | POA: Diagnosis not present

## 2016-01-29 DIAGNOSIS — Z136 Encounter for screening for cardiovascular disorders: Secondary | ICD-10-CM | POA: Diagnosis not present

## 2016-01-29 DIAGNOSIS — Z0000 Encounter for general adult medical examination without abnormal findings: Secondary | ICD-10-CM | POA: Diagnosis not present

## 2016-01-29 DIAGNOSIS — E782 Mixed hyperlipidemia: Secondary | ICD-10-CM | POA: Diagnosis not present

## 2016-01-29 DIAGNOSIS — N400 Enlarged prostate without lower urinary tract symptoms: Secondary | ICD-10-CM | POA: Diagnosis not present

## 2016-01-29 DIAGNOSIS — Z23 Encounter for immunization: Secondary | ICD-10-CM | POA: Diagnosis not present

## 2016-01-29 DIAGNOSIS — F339 Major depressive disorder, recurrent, unspecified: Secondary | ICD-10-CM | POA: Diagnosis not present

## 2016-01-29 DIAGNOSIS — Z1159 Encounter for screening for other viral diseases: Secondary | ICD-10-CM | POA: Diagnosis not present

## 2016-01-29 DIAGNOSIS — F419 Anxiety disorder, unspecified: Secondary | ICD-10-CM | POA: Diagnosis not present

## 2016-01-29 DIAGNOSIS — I10 Essential (primary) hypertension: Secondary | ICD-10-CM | POA: Diagnosis not present

## 2016-01-29 DIAGNOSIS — E669 Obesity, unspecified: Secondary | ICD-10-CM | POA: Diagnosis not present

## 2016-01-29 DIAGNOSIS — Z1389 Encounter for screening for other disorder: Secondary | ICD-10-CM | POA: Diagnosis not present

## 2016-01-29 DIAGNOSIS — M5090 Cervical disc disorder, unspecified, unspecified cervical region: Secondary | ICD-10-CM | POA: Diagnosis not present

## 2016-01-29 DIAGNOSIS — I2510 Atherosclerotic heart disease of native coronary artery without angina pectoris: Secondary | ICD-10-CM | POA: Diagnosis not present

## 2016-02-06 DIAGNOSIS — M170 Bilateral primary osteoarthritis of knee: Secondary | ICD-10-CM | POA: Diagnosis not present

## 2016-02-20 ENCOUNTER — Other Ambulatory Visit: Payer: Self-pay | Admitting: Internal Medicine

## 2016-02-20 DIAGNOSIS — Z87891 Personal history of nicotine dependence: Secondary | ICD-10-CM

## 2016-02-20 DIAGNOSIS — Z136 Encounter for screening for cardiovascular disorders: Secondary | ICD-10-CM

## 2016-02-26 DIAGNOSIS — I809 Phlebitis and thrombophlebitis of unspecified site: Secondary | ICD-10-CM | POA: Diagnosis not present

## 2016-03-02 ENCOUNTER — Ambulatory Visit
Admission: RE | Admit: 2016-03-02 | Discharge: 2016-03-02 | Disposition: A | Discharge status: Home / Self Care | Payer: Medicare Other | Source: Ambulatory Visit | Attending: Internal Medicine | Admitting: Internal Medicine

## 2016-03-02 DIAGNOSIS — Z136 Encounter for screening for cardiovascular disorders: Secondary | ICD-10-CM | POA: Diagnosis not present

## 2016-03-02 DIAGNOSIS — Z87891 Personal history of nicotine dependence: Secondary | ICD-10-CM | POA: Diagnosis not present

## 2016-03-11 DIAGNOSIS — H1859 Other hereditary corneal dystrophies: Secondary | ICD-10-CM | POA: Diagnosis not present

## 2016-03-11 DIAGNOSIS — H16223 Keratoconjunctivitis sicca, not specified as Sjogren's, bilateral: Secondary | ICD-10-CM | POA: Diagnosis not present

## 2016-03-11 DIAGNOSIS — H18833 Recurrent erosion of cornea, bilateral: Secondary | ICD-10-CM | POA: Diagnosis not present

## 2016-04-01 DIAGNOSIS — J0100 Acute maxillary sinusitis, unspecified: Secondary | ICD-10-CM | POA: Diagnosis not present

## 2016-04-03 NOTE — Progress Notes (Signed)
Cardiology Office Note   Date:  04/06/2016   ID:  Christopher Bradford, DOB 04-Jul-1950, MRN [id number]  PCP:  [name], MD  Cardiologist: [name] MD  Chief Complaint  Patient presents with  . Follow-up    6 months  pt states no Sx.--has sinus infection  . Atrial Fibrillation  . Coronary Artery Disease      History of Present Illness: Christopher Bradford is a 65 y.o. male who presents for follow-up CAD and atrial fibrillation.    He is formerly a patient of [name].  The patient has a past history of ischemic heart disease. He has a past history of a stent to the circumflex in 1998. He had a cardiac catheterization in 2005 showing that the left circumflex stent was widely patent. Patient is on statin therapy. The patient has a history of hypercholesterolemia and essential hypertension and exogenous obesity. He is also the primary caregiver for his wife who has a lot of chronic medical problems.  In September 2016 he was found to be in atrial fibrillation which was asymptomatic.He had presented to his PCP for a physical on 01/28/15 and was incidentally noted to be in rate-controlled atrial fibrillation.  Aspirin was discontinued and he was started on Eliquis 5mg  BID (CHADSVASC = 3).  His heart rate was already well controlled and he did not require any additional A-V node blocking medication. He has not had any stroke or TIA symptoms. Echo 02/14/15 showed normal LV function. Valvular function was good. The LA was severely enlarged and the RA was moderately enlarged.   On follow up today he is doing well. Exercise is limited by severe arthritis. He decided not to have  bilateral TKR since he is not standing on hard floors as much since he retired. He still denies any chest pain, SOB, dizziness, fatigue or palpitations. He is tolerating Eliquis well. He has lost 16 lbs and is trying to lose more.  Past Medical History:  Diagnosis Date  . Anxiety   . Atrial fibrillation  ([name])   . BPH (benign prostatic hyperplasia)   . CAD (coronary artery disease)    a. s/p stent to Cx 1998. b. Stent (patent by [facility] 2005 with otherwise mild luminal irreg, normal EF at that time.  . Depression   . GERD (gastroesophageal reflux disease)   . History of gout   . Hyperlipidemia   . Hyperplastic colon polyp   . Hypertension   . Obesity   . OSA (obstructive sleep apnea)   . Osteoarthritis     Past Surgical History:  Procedure Laterality Date  . BACK SURGERY    . CARDIAC CATHETERIZATION  1998   stent to left circumflex  . CARDIAC CATHETERIZATION  2005   stent widely patent  . CARPAL TUNNEL RELEASE    . COLONOSCOPY    . KNEE ARTHROSCOPY Left   . SHOULDER SURGERY    . TONSILLECTOMY AND ADENOIDECTOMY     age 76     Current Outpatient Prescriptions  Medication Sig Dispense Refill  . amLODipine (NORVASC) 2.5 MG tablet Take 2.5 mg by mouth daily.      Marland Kitchen azelastine (ASTELIN) 137 MCG/SPRAY nasal spray Place 1 spray into the nose 2 (two) times daily.     . benazepril (LOTENSIN) 40 MG tablet Take 40 mg by mouth daily.      . busPIRone (BUSPAR) 5 MG tablet Take 5 mg by mouth 2 (two) times daily.  6  . cetirizine (  ZYRTEC) 5 MG tablet Take 5 mg by mouth daily.      . Coenzyme Q10 (CO Q 10 PO) Take 1 capsule by mouth daily.     Marland Kitchen COLCRYS 0.6 MG tablet Take 1 tablet by mouth 2 (two) times daily as needed (gout).   3  . diazepam (VALIUM) 2 MG tablet Take 2 mg by mouth daily as needed (sleep).     Marland Kitchen ELIQUIS 5 MG TABS tablet TAKE 1 TABLET(5 MG) BY MOUTH TWICE DAILY 180 tablet 1  . Omega-3 Fatty Acids (FISH OIL PO) Take 1 capsule by mouth daily.     . Omeprazole (PRILOSEC PO) Take 1 capsule by mouth daily.     . simvastatin (ZOCOR) 40 MG tablet Take 40 mg by mouth at bedtime.      . traMADol (ULTRAM) 50 MG tablet Take 50 mg by mouth every 12 (twelve) hours as needed (pain).   0   No current facility-administered medications for this visit.     Allergies:   Penicillins     Social History:  The patient  reports that he has quit smoking. His smoking use included Cigarettes. He does not have any smokeless tobacco history on file.   Family History:  The patient's family history includes Alcohol abuse in his sister; Cervical cancer in his sister; Dementia in his mother; Hypertension in his father; Prostate cancer in his father; Suicidality in his father.    ROS:  Please see the history of present illness.   Otherwise, review of systems are positive for none.   All other systems are reviewed and negative.    PHYSICAL EXAM: VS:  BP 116/74 (BP Location: Left Arm, Patient Position: Sitting, Cuff Size: Normal)   Pulse 84   Ht 5\' 9"  (1.753 m)   Wt 228 lb 6.4 oz (103.6 kg)   BMI 33.73 kg/m  , BMI Body mass index is 33.73 kg/m. GEN: Well nourished, overweight, in no acute distress  HEENT: normal  Neck: no JVD, carotid bruits, or masses Cardiac: Irregularly irregular; no murmurs, rubs, or gallops,no edema  Respiratory:  clear to auscultation bilaterally, normal work of breathing GI: soft, nontender, nondistended, + BS MS: no deformity or atrophy  Skin: warm and dry, no rash Neuro:  Strength and sensation are intact Psych: euthymic mood, full affect   EKG:  EKG is  ordered today. The ekg ordered today Afib with rate 84. PVCs. Otherwise normal.   Recent Labs: No results found for requested labs within last 8760 hours.    Lipid Panel No results found for: CHOL, TRIG, HDL, CHOLHDL, VLDL, LDLCALC, LDLDIRECT    Wt Readings from Last 3 Encounters:  04/06/16 228 lb 6.4 oz (103.6 kg)  07/22/15 244 lb 5 oz (110.8 kg)  03/28/15 236 lb 12.8 oz (107.4 kg)    Labs reviewed from primary care dated 01/29/16: cholesterol 145, triglycerides 103, LDL 73, HDL 52.  CMET normal.     ASSESSMENT AND PLAN:  1. Ischemic heart disease with previous stent to circumflex coronary artery in 1998. Cardiac catheterization in 2005 showed that the stent was widely patent. No  anginal symptoms.  2. Essential hypertension- controlled.  3. Obesity 4. Hypercholesterolemia- well controlled on statin. 5. Osteoarthritis of both knees 6.  Permanent  atrial fibrillation- rate is well controlled on no AV nodal blocking agents. He is asymptomatic. Appropriately anticoagulated with Eliquis.   Disposition: Continue current medication.follow up in 6 months.   Current medicines are reviewed at length with the  patient today.  The patient does not have concerns regarding medicines.  The following changes have been made:  no change  Labs/ tests ordered today include:   No orders of the defined types were placed in this encounter.    Disposition: Follow up in 6 months.  Signed, [name] MD, [facility]  04/06/2016 11:44 AM

## 2016-04-06 ENCOUNTER — Encounter: Payer: Self-pay | Admitting: Podiatry

## 2016-04-06 ENCOUNTER — Encounter: Payer: Self-pay | Admitting: Cardiology

## 2016-04-06 ENCOUNTER — Ambulatory Visit (INDEPENDENT_AMBULATORY_CARE_PROVIDER_SITE_OTHER): Payer: Medicare Other | Admitting: Podiatry

## 2016-04-06 ENCOUNTER — Ambulatory Visit (INDEPENDENT_AMBULATORY_CARE_PROVIDER_SITE_OTHER): Payer: Medicare Other | Admitting: Cardiology

## 2016-04-06 VITALS — BP 116/74 | HR 84 | Ht 69.0 in | Wt 228.4 lb

## 2016-04-06 DIAGNOSIS — I10 Essential (primary) hypertension: Secondary | ICD-10-CM

## 2016-04-06 DIAGNOSIS — E785 Hyperlipidemia, unspecified: Secondary | ICD-10-CM

## 2016-04-06 DIAGNOSIS — I482 Chronic atrial fibrillation, unspecified: Secondary | ICD-10-CM

## 2016-04-06 DIAGNOSIS — L84 Corns and callosities: Secondary | ICD-10-CM | POA: Diagnosis not present

## 2016-04-06 DIAGNOSIS — I259 Chronic ischemic heart disease, unspecified: Secondary | ICD-10-CM

## 2016-04-06 DIAGNOSIS — M779 Enthesopathy, unspecified: Secondary | ICD-10-CM | POA: Diagnosis not present

## 2016-04-06 MED ORDER — TRIAMCINOLONE ACETONIDE 10 MG/ML IJ SUSP
10.0000 mg | Freq: Once | INTRAMUSCULAR | Status: AC
Start: 2016-04-06 — End: 2016-04-06
  Administered 2016-04-06: 10 mg

## 2016-04-06 NOTE — Patient Instructions (Signed)
Continue your current therapy  I will see you in 6 months.   

## 2016-04-06 NOTE — Progress Notes (Signed)
Subjective:     Patient ID: Christopher Bradford, male   DOB: 09/25/1950, 65 y.o.   MRN: [id number]  HPI patient points to left foot stating that this has returned and it is quite sore when pressed   Review of Systems     Objective:   Physical Exam Neurovascular status intact with patient found to have inflammation pain around the fifth metatarsal left with fluid buildup with keratotic tissue formation    Assessment:     Inflammatory capsulitis fifth MPJ left with lesion formation    Plan:     Careful injection of the left fifth MPJ 3 mg Dexon some Kenalog 5 mill grams Xylocaine and deep debridement of lesion accomplished

## 2016-04-22 ENCOUNTER — Encounter: Payer: Self-pay | Admitting: Vascular Surgery

## 2016-04-27 ENCOUNTER — Other Ambulatory Visit: Payer: Self-pay | Admitting: *Deleted

## 2016-04-27 DIAGNOSIS — I83893 Varicose veins of bilateral lower extremities with other complications: Secondary | ICD-10-CM

## 2016-04-30 ENCOUNTER — Ambulatory Visit (INDEPENDENT_AMBULATORY_CARE_PROVIDER_SITE_OTHER): Payer: Medicare Other | Admitting: Vascular Surgery

## 2016-04-30 ENCOUNTER — Encounter: Payer: Self-pay | Admitting: Vascular Surgery

## 2016-04-30 ENCOUNTER — Ambulatory Visit (HOSPITAL_COMMUNITY)
Admission: RE | Admit: 2016-04-30 | Discharge: 2016-04-30 | Disposition: A | Discharge status: Home / Self Care | Payer: Medicare Other | Source: Ambulatory Visit | Attending: Vascular Surgery | Admitting: Vascular Surgery

## 2016-04-30 VITALS — BP 139/91 | HR 83 | Temp 98.2°F | Resp 18 | Ht 69.0 in | Wt 223.0 lb

## 2016-04-30 DIAGNOSIS — I83893 Varicose veins of bilateral lower extremities with other complications: Secondary | ICD-10-CM | POA: Diagnosis not present

## 2016-04-30 DIAGNOSIS — I259 Chronic ischemic heart disease, unspecified: Secondary | ICD-10-CM | POA: Diagnosis not present

## 2016-04-30 DIAGNOSIS — I83813 Varicose veins of bilateral lower extremities with pain: Secondary | ICD-10-CM

## 2016-04-30 NOTE — Progress Notes (Signed)
Patient name: Christopher Bradford MRN: [id number] DOB: Feb 04, 1951 Sex: male  REASON FOR CONSULT: Worsening varicose veins left lower extremity. Referred by Dr. Lysle Rubens.   HPI: Christopher Bradford is a 65 y.o. male, who presents with a small lump on the posterior aspect of his left leg which is tender. He has varicose veins in both lower extremities and is sent for vascular consultation. He is unaware of any previous history of DVT or phlebitis. 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 He describes some aching pain in his legs associated with standing and relieved with elevation. He denies any significant leg swelling.  He has previously tried to wear compression stockings but they caused his legs to cramp.  I reviewed the records that were sent with the patient from his visit on 02/26/2016.. The patient was evaluated with a lump on the back of the left leg which he had had for one week. It was felt that he might have superficial thrombophlebitis.  Past Medical History:  Diagnosis Date  . Anxiety   . Atrial fibrillation ([place])   . BPH (benign prostatic hyperplasia)   . CAD (coronary artery disease)    a. s/p stent to Cx 1998. b. Stent (patent by [facility] 2005 with otherwise mild luminal irreg, normal EF at that time.  . Depression   . GERD (gastroesophageal reflux disease)   . History of gout   . Hyperlipidemia   . Hyperplastic colon polyp   . Hypertension   . Obesity   . OSA (obstructive sleep apnea)   . Osteoarthritis     Family History  Problem Relation Age of Onset  . Dementia Mother   . Hypertension Father   . Prostate cancer Father   . Suicidality Father   . Cervical cancer Sister   . Alcohol abuse Sister     SOCIAL HISTORY: Social History   Social History  . Marital status: Single    Spouse name: N/A  . Number of children: N/A  . Years of education: N/A   Occupational History  . Not on file.   Social History Main Topics  .  Smoking status: Former Smoker    Types: Cigarettes  . Smokeless tobacco: Never Used  . Alcohol use No  . Drug use: No  . Sexual activity: Not on file   Other Topics Concern  . Not on file   Social History Narrative  . No narrative on file    Allergies  Allergen Reactions  . Penicillins Rash    Current Outpatient Prescriptions  Medication Sig Dispense Refill  . amLODipine (NORVASC) 2.5 MG tablet Take 2.5 mg by mouth daily.      Marland Kitchen azelastine (ASTELIN) 137 MCG/SPRAY nasal spray Place 1 spray into the nose 2 (two) times daily.     . benazepril (LOTENSIN) 40 MG tablet Take 40 mg by mouth daily.      . busPIRone (BUSPAR) 5 MG tablet Take 5 mg by mouth 2 (two) times daily.  6  . cetirizine (ZYRTEC) 5 MG tablet Take 5 mg by mouth daily.      . Coenzyme Q10 (CO Q 10 PO) Take 1 capsule by mouth daily.     Marland Kitchen COLCRYS 0.6 MG tablet Take 1 tablet by mouth 2 (two) times daily as needed (gout).   3  . diazepam (VALIUM) 2 MG tablet Take 2 mg by mouth daily as needed (sleep).     Marland Kitchen  ELIQUIS 5 MG TABS tablet TAKE 1 TABLET(5 MG) BY MOUTH TWICE DAILY 180 tablet 1  . Omega-3 Fatty Acids (FISH OIL PO) Take 1 capsule by mouth daily.     . Omeprazole (PRILOSEC PO) Take 1 capsule by mouth daily.     . simvastatin (ZOCOR) 40 MG tablet Take 40 mg by mouth at bedtime.      . traMADol (ULTRAM) 50 MG tablet Take 50 mg by mouth every 12 (twelve) hours as needed (pain).   0   No current facility-administered medications for this visit.     REVIEW OF SYSTEMS:  [X]  denotes positive finding, [ ]  denotes negative finding Cardiac  Comments:  Chest pain or chest pressure:    Shortness of breath upon exertion:    Short of breath when lying flat:    Irregular heart rhythm: X       Vascular    Pain in calf, thigh, or hip brought on by ambulation:    Pain in feet at night that wakes you up from your sleep:     Blood clot in your veins: X   Leg swelling:         Pulmonary    Oxygen at home:    Productive  cough:     Wheezing:         Neurologic    Sudden weakness in arms or legs:     Sudden numbness in arms or legs:     Sudden onset of difficulty speaking or slurred speech:    Temporary loss of vision in one eye:     Problems with dizziness:         Gastrointestinal    Blood in stool:     Vomited blood:         Genitourinary    Burning when urinating:     Blood in urine:        Psychiatric    Major depression:         Hematologic    Bleeding problems:    Problems with blood clotting too easily:        Skin    Rashes or ulcers:        Constitutional    Fever or chills:      PHYSICAL EXAM: Vitals:   04/30/16 1157 04/30/16 1159  BP: (!) 146/93 (!) 139/91  Pulse: 83   Resp: 18   Temp: 98.2 F (36.8 C)   TempSrc: Oral   SpO2: 98%   Weight: 223 lb (101.2 kg)   Height: 5\' 9"  (1.753 m)     GENERAL: The patient is a well-nourished male, in no acute distress. The vital signs are documented above. CARDIAC: There is a regular rate and rhythm.  VASCULAR: I do not detect carotid bruits. On the right side he has a palpable femoral, popliteal, dorsalis pedis, and posterior tibial pulse. On the left side he has a palpable femoral, popliteal, and posterior tibial pulse. PULMONARY: There is good air exchange bilaterally without wheezing or rales. ABDOMEN: Soft and non-tender with normal pitched bowel sounds.  MUSCULOSKELETAL: There are no major deformities or cyanosis. NEUROLOGIC: No focal weakness or paresthesias are detected. SKIN: He has some reticular veins and spider veins bilaterally. There is a small lump on the posterior aspect of his left leg which looks like a small thrombosed varicose vein on exam. PSYCHIATRIC: The patient has a normal affect.  DATA:   BILATERAL LOWER SHOWED A VENOUS DUPLEX SCAN: I have independently interpreted  his bilateral lower extremity venous duplex scan.  On the right side, there is no evidence of DVT or superficial thrombophlebitis. There  is deep vein reflux involving the common femoral vein and femoral vein. There is also reflux in the right great saphenous vein and small saphenous vein.  On the left side, there is no evidence of DVT. There is reflux in the left common femoral vein and also reflux at the saphenofemoral junction. Based on the duplex images I suspect the lump is a thrombosed varicose vein.  ABDOMINAL AORTIC ANEURYSM SCREENING ULTRASOUND: This patient had a screening ultrasound for abdominal aortic aneurysm on 03/02/2016. This showed that the maximum diameter of his aorta was 2.3 cm. Thus there was no evidence of aneurysmal disease.  MEDICAL ISSUES:  PHLEBITIS LEFT LEG: Based on his exam I think the small lump in the posterior left leg is a small thrombosed varicose vein. It is tender which is consistent with phlebitis. We have discussed the importance of elevation and warm compresses. He does not think he needs any ibuprofen for pain. He does not tolerate compression stockings. Based on his duplex study he does have evidence of some chronic venous insufficiency bilaterally. Again we have discussed the importance of intermittent leg elevation. I have encouraged him to avoid prolonged sitting and standing and to exercise as much as possible. We also discussed possible water aerobics which I think is very helpful for people with venous disease. I'll be happy see him back at any time in the future for any vascular issues arise.   Deitra Mayo [facility] [phone]

## 2016-05-04 ENCOUNTER — Encounter: Payer: Medicare Other | Admitting: Vascular Surgery

## 2016-07-30 DIAGNOSIS — F322 Major depressive disorder, single episode, severe without psychotic features: Secondary | ICD-10-CM | POA: Diagnosis not present

## 2016-07-30 DIAGNOSIS — I10 Essential (primary) hypertension: Secondary | ICD-10-CM | POA: Diagnosis not present

## 2016-07-30 DIAGNOSIS — N400 Enlarged prostate without lower urinary tract symptoms: Secondary | ICD-10-CM | POA: Diagnosis not present

## 2016-07-30 DIAGNOSIS — K5289 Other specified noninfective gastroenteritis and colitis: Secondary | ICD-10-CM | POA: Diagnosis not present

## 2016-07-30 DIAGNOSIS — I2510 Atherosclerotic heart disease of native coronary artery without angina pectoris: Secondary | ICD-10-CM | POA: Diagnosis not present

## 2016-07-30 DIAGNOSIS — K219 Gastro-esophageal reflux disease without esophagitis: Secondary | ICD-10-CM | POA: Diagnosis not present

## 2016-07-30 DIAGNOSIS — M1990 Unspecified osteoarthritis, unspecified site: Secondary | ICD-10-CM | POA: Diagnosis not present

## 2016-07-30 DIAGNOSIS — F419 Anxiety disorder, unspecified: Secondary | ICD-10-CM | POA: Diagnosis not present

## 2016-07-30 DIAGNOSIS — G4733 Obstructive sleep apnea (adult) (pediatric): Secondary | ICD-10-CM | POA: Diagnosis not present

## 2016-07-30 DIAGNOSIS — I4891 Unspecified atrial fibrillation: Secondary | ICD-10-CM | POA: Diagnosis not present

## 2016-07-30 DIAGNOSIS — J309 Allergic rhinitis, unspecified: Secondary | ICD-10-CM | POA: Diagnosis not present

## 2016-07-30 DIAGNOSIS — E782 Mixed hyperlipidemia: Secondary | ICD-10-CM | POA: Diagnosis not present

## 2016-09-09 DIAGNOSIS — H17822 Peripheral opacity of cornea, left eye: Secondary | ICD-10-CM | POA: Diagnosis not present

## 2016-09-09 DIAGNOSIS — H1859 Other hereditary corneal dystrophies: Secondary | ICD-10-CM | POA: Diagnosis not present

## 2016-09-29 DIAGNOSIS — M1712 Unilateral primary osteoarthritis, left knee: Secondary | ICD-10-CM | POA: Diagnosis not present

## 2016-09-29 DIAGNOSIS — M170 Bilateral primary osteoarthritis of knee: Secondary | ICD-10-CM | POA: Diagnosis not present

## 2016-09-29 DIAGNOSIS — M1711 Unilateral primary osteoarthritis, right knee: Secondary | ICD-10-CM | POA: Diagnosis not present

## 2016-10-07 NOTE — Progress Notes (Signed)
Cardiology Office Note   Date:  10/08/2016   ID:  Christopher Bradford, DOB Nov 11, 1950, MRN [id number]  PCP:  [name], MD  Cardiologist: Timmya Blazier Martinique MD  Chief Complaint  Patient presents with  . Coronary Artery Disease  . Hypertension      History of Present Illness: Christopher Bradford is a 66 y.o. male who presents for follow-up CAD and atrial fibrillation.   The patient has a past history of ischemic heart disease. He has a past history of a stent to the circumflex in 1998. He had a cardiac catheterization in 2005 showing that the left circumflex stent was widely patent. Patient is on statin therapy. The patient has a history of hypercholesterolemia and essential hypertension and exogenous obesity. He is also the primary caregiver for his wife who has a lot of chronic medical problems.  In September 2016 he was found to be in atrial fibrillation which was asymptomatic.He had presented to his PCP for a physical on 01/28/15 and was incidentally noted to be in rate-controlled atrial fibrillation.  Aspirin was discontinued and he was started on Eliquis 5mg  BID (CHADSVASC = 3).  His heart rate was already well controlled and he did not require any additional A-V node blocking medication. He has not had any stroke or TIA symptoms. Echo 02/14/15 showed normal LV function. Valvular function was good. The LA was severely enlarged and the RA was moderately enlarged.   On follow up today he is doing well. Exercise is limited by severe arthritis in his knees. Had recent steroid injection and is planning to have hip injected as well.  He  denies any chest pain, SOB, dizziness, fatigue or palpitations. His daughter is getting married in 2 weeks.   Past Medical History:  Diagnosis Date  . Anxiety   . Atrial fibrillation ([place])   . BPH (benign prostatic hyperplasia)   . CAD (coronary artery disease)    a. s/p stent to Cx 1998. b. Stent (patent by [facility] 2005 with otherwise mild luminal irreg,  normal EF at that time.  . Depression   . GERD (gastroesophageal reflux disease)   . History of gout   . Hyperlipidemia   . Hyperplastic colon polyp   . Hypertension   . Obesity   . OSA (obstructive sleep apnea)   . Osteoarthritis     Past Surgical History:  Procedure Laterality Date  . BACK SURGERY    . CARDIAC CATHETERIZATION  1998   stent to left circumflex  . CARDIAC CATHETERIZATION  2005   stent widely patent  . CARPAL TUNNEL RELEASE    . COLONOSCOPY    . KNEE ARTHROSCOPY Left   . SHOULDER SURGERY    . TONSILLECTOMY AND ADENOIDECTOMY     age 57     Current Outpatient Prescriptions  Medication Sig Dispense Refill  . amLODipine (NORVASC) 2.5 MG tablet Take 2.5 mg by mouth daily.      Marland Kitchen azelastine (ASTELIN) 137 MCG/SPRAY nasal spray Place 1 spray into the nose 2 (two) times daily.     . benazepril (LOTENSIN) 40 MG tablet Take 40 mg by mouth daily.      . busPIRone (BUSPAR) 5 MG tablet Take 5 mg by mouth 2 (two) times daily.  6  . cetirizine (ZYRTEC) 5 MG tablet Take 5 mg by mouth daily.      . Coenzyme Q10 (CO Q 10 PO) Take 1 capsule by mouth daily.     Marland Kitchen COLCRYS 0.6 MG  tablet Take 1 tablet by mouth 2 (two) times daily as needed (gout).   3  . diazepam (VALIUM) 2 MG tablet Take 2 mg by mouth daily as needed (sleep).     Marland Kitchen ELIQUIS 5 MG TABS tablet TAKE 1 TABLET(5 MG) BY MOUTH TWICE DAILY 180 tablet 1  . Omega-3 Fatty Acids (FISH OIL PO) Take 1 capsule by mouth daily.     . Omeprazole (PRILOSEC PO) Take 1 capsule by mouth daily.     . simvastatin (ZOCOR) 40 MG tablet Take 40 mg by mouth at bedtime.      . traMADol (ULTRAM) 50 MG tablet Take 50 mg by mouth every 12 (twelve) hours as needed (pain).   0   No current facility-administered medications for this visit.     Allergies:   Penicillins    Social History:  The patient  reports that he has quit smoking. His smoking use included Cigarettes. He has never used smokeless tobacco. He reports that he does not drink  alcohol or use drugs.   Family History:  The patient's family history includes Alcohol abuse in his sister; Cervical cancer in his sister; Dementia in his mother; Hypertension in his father; Prostate cancer in his father; Suicidality in his father.    ROS:  Please see the history of present illness.   Otherwise, review of systems are positive for none.   All other systems are reviewed and negative.    PHYSICAL EXAM: VS:  BP 136/84   Pulse 89   Ht 5\' 9"  (1.753 m)   Wt 229 lb 9.6 oz (104.1 kg)   SpO2 95%   BMI 33.91 kg/m  , BMI Body mass index is 33.91 kg/m. GEN: Well nourished, overweight, in no acute distress  HEENT: normal  Neck: no JVD, carotid bruits, or masses Cardiac: Irregularly irregular; no murmurs, rubs, or gallops,no edema  Respiratory:  clear to auscultation bilaterally, normal work of breathing GI: soft, nontender, nondistended, + BS MS: no deformity or atrophy  Skin: warm and dry, no rash Neuro:  Strength and sensation are intact Psych: euthymic mood, full affect   EKG:  EKG is not ordered today.   Recent Labs: No results found for requested labs within last 8760 hours.    Lipid Panel No results found for: CHOL, TRIG, HDL, CHOLHDL, VLDL, LDLCALC, LDLDIRECT    Wt Readings from Last 3 Encounters:  10/08/16 229 lb 9.6 oz (104.1 kg)  04/30/16 223 lb (101.2 kg)  04/06/16 228 lb 6.4 oz (103.6 kg)    Labs reviewed from primary care dated 01/29/16: cholesterol 145, triglycerides 103, LDL 73, HDL 52.  CMET normal.     ASSESSMENT AND PLAN:  1. Ischemic heart disease with previous stent to circumflex coronary artery in 1998. Cardiac catheterization in 2005 showed that the stent was widely patent. No anginal symptoms. Continue medical therapy. 2. Essential hypertension- controlled.  3. Obesity 4. Hypercholesterolemia- well controlled on statin. 5. Osteoarthritis of bilateral knees 6.  Permanent  atrial fibrillation- rate is well controlled on no AV nodal  blocking agents. He is asymptomatic. Continue  anticoagulation with Eliquis.   Disposition: Continue current medication.follow up in 6 months.   Current medicines are reviewed at length with the patient today.  The patient does not have concerns regarding medicines.  The following changes have been made:  no change  Labs/ tests ordered today include:   No orders of the defined types were placed in this encounter.    Disposition: Follow  up in 6 months.  Signed, Chala Gul Martinique MD, [facility]  10/08/2016 3:42 PM

## 2016-10-08 ENCOUNTER — Encounter: Payer: Self-pay | Admitting: Cardiology

## 2016-10-08 ENCOUNTER — Ambulatory Visit (INDEPENDENT_AMBULATORY_CARE_PROVIDER_SITE_OTHER): Payer: Medicare Other | Admitting: Cardiology

## 2016-10-08 VITALS — BP 136/84 | HR 89 | Ht 69.0 in | Wt 229.6 lb

## 2016-10-08 DIAGNOSIS — I10 Essential (primary) hypertension: Secondary | ICD-10-CM | POA: Diagnosis not present

## 2016-10-08 DIAGNOSIS — I2510 Atherosclerotic heart disease of native coronary artery without angina pectoris: Secondary | ICD-10-CM

## 2016-10-08 DIAGNOSIS — I482 Chronic atrial fibrillation, unspecified: Secondary | ICD-10-CM

## 2016-10-08 DIAGNOSIS — I259 Chronic ischemic heart disease, unspecified: Secondary | ICD-10-CM | POA: Diagnosis not present

## 2016-10-08 DIAGNOSIS — Z9861 Coronary angioplasty status: Secondary | ICD-10-CM

## 2016-10-08 DIAGNOSIS — E785 Hyperlipidemia, unspecified: Secondary | ICD-10-CM

## 2016-10-08 NOTE — Patient Instructions (Signed)
Continue your current therapy  I will see you in 6 months.   

## 2016-10-09 ENCOUNTER — Other Ambulatory Visit: Payer: Self-pay | Admitting: *Deleted

## 2016-10-09 MED ORDER — APIXABAN 5 MG PO TABS: 5.0000 mg | ORAL_TABLET | Freq: Two times a day (BID) | ORAL | 1 refills | Status: DC

## 2016-10-09 NOTE — Telephone Encounter (Signed)
Recent blood work results requested from [employer].   Last available from 2012.

## 2016-10-09 NOTE — Telephone Encounter (Signed)
Results received (blood work from 01/29/2016)  Hgb 15.1 HCT 46 PLT 194 Scr 0.88 Ast 14 Alt 14

## 2016-10-13 DIAGNOSIS — M5431 Sciatica, right side: Secondary | ICD-10-CM | POA: Diagnosis not present

## 2016-11-26 ENCOUNTER — Encounter (HOSPITAL_COMMUNITY): Payer: Self-pay | Admitting: Emergency Medicine

## 2016-11-26 ENCOUNTER — Emergency Department (HOSPITAL_COMMUNITY)
Admission: EM | Admit: 2016-11-26 | Discharge: 2016-11-26 | Disposition: A | Discharge status: Home / Self Care | Payer: Medicare Other | Attending: Emergency Medicine | Admitting: Emergency Medicine

## 2016-11-26 DIAGNOSIS — Z79899 Other long term (current) drug therapy: Secondary | ICD-10-CM | POA: Diagnosis not present

## 2016-11-26 DIAGNOSIS — R21 Rash and other nonspecific skin eruption: Secondary | ICD-10-CM | POA: Diagnosis not present

## 2016-11-26 DIAGNOSIS — T7840XA Allergy, unspecified, initial encounter: Secondary | ICD-10-CM | POA: Diagnosis not present

## 2016-11-26 DIAGNOSIS — I10 Essential (primary) hypertension: Secondary | ICD-10-CM | POA: Diagnosis not present

## 2016-11-26 DIAGNOSIS — L299 Pruritus, unspecified: Secondary | ICD-10-CM | POA: Diagnosis not present

## 2016-11-26 DIAGNOSIS — T63461A Toxic effect of venom of wasps, accidental (unintentional), initial encounter: Secondary | ICD-10-CM | POA: Insufficient documentation

## 2016-11-26 DIAGNOSIS — I2510 Atherosclerotic heart disease of native coronary artery without angina pectoris: Secondary | ICD-10-CM | POA: Insufficient documentation

## 2016-11-26 DIAGNOSIS — Z87891 Personal history of nicotine dependence: Secondary | ICD-10-CM | POA: Insufficient documentation

## 2016-11-26 DIAGNOSIS — I4891 Unspecified atrial fibrillation: Secondary | ICD-10-CM | POA: Insufficient documentation

## 2016-11-26 MED ORDER — METHYLPREDNISOLONE SODIUM SUCC 125 MG IJ SOLR
125.0000 mg | Freq: Once | INTRAMUSCULAR | Status: AC
  Administered 2016-11-26: 125 mg via INTRAVENOUS
  Filled 2016-11-26: qty 2

## 2016-11-26 MED ORDER — PREDNISONE 10 MG PO TABS: 20.0000 mg | ORAL_TABLET | Freq: Every day | ORAL | 0 refills | Status: DC

## 2016-11-26 MED ORDER — FAMOTIDINE IN NACL 20-0.9 MG/50ML-% IV SOLN
20.0000 mg | Freq: Once | INTRAVENOUS | Status: AC
  Administered 2016-11-26: 20 mg via INTRAVENOUS
  Filled 2016-11-26: qty 50

## 2016-11-26 MED ORDER — EPINEPHRINE 0.3 MG/0.3ML IJ SOAJ: 0.3000 mg | Freq: Once | INTRAMUSCULAR | 0 refills | Status: AC

## 2016-11-26 MED ORDER — DIPHENHYDRAMINE HCL 50 MG/ML IJ SOLN
25.0000 mg | Freq: Once | INTRAMUSCULAR | Status: AC
  Administered 2016-11-26: 25 mg via INTRAVENOUS
  Filled 2016-11-26: qty 1

## 2016-11-26 NOTE — ED Triage Notes (Signed)
Patient complaining of several bee stings that happened an hour ago. Patient states it happened over hour. Patient has taken a benadryl. Patient complaining of having trouble breathing. Patient seems to not be in respiratory distress at this time.

## 2016-11-26 NOTE — ED Provider Notes (Signed)
[facility] DEPT Provider Note   CSN: [id number] Arrival date & time: 11/26/16  1935     History   Chief Complaint Chief Complaint  Patient presents with  . Insect Bite  . Allergic Reaction    HPI Christopher Bradford is a 66 y.o. male.   The history is provided by the patient. No language interpreter was used.  Allergic Reaction  Presenting symptoms: itching and rash   Presenting symptoms: no difficulty breathing, no difficulty swallowing, no swelling and no wheezing   Rash:    Location:  Full body   Quality: itchiness and redness     Severity:  Moderate   Onset quality:  Sudden   Duration:  1 hour   Timing:  Constant   Progression:  Worsening Severity:  Severe Prior allergic episodes:  No prior episodes Context: insect bite/sting   Relieved by:  Nothing Worsened by:  Nothing Ineffective treatments:  Antihistamines (one pill of benadryl at home)   Past Medical History:  Diagnosis Date  . Anxiety   . Atrial fibrillation ([place])   . BPH (benign prostatic hyperplasia)   . CAD (coronary artery disease)    a. s/p stent to Cx 1998. b. Stent (patent by [facility] 2005 with otherwise mild luminal irreg, normal EF at that time.  . Depression   . GERD (gastroesophageal reflux disease)   . History of gout   . Hyperlipidemia   . Hyperplastic colon polyp   . Hypertension   . Obesity   . OSA (obstructive sleep apnea)   . Osteoarthritis     Patient Active Problem List   Diagnosis Date Noted  . Atrial fibrillation ([place]) 03/28/2015  . Essential hypertension 03/21/2014  . Dyslipidemia 04/20/2011  . Ischemic heart disease   . History of gout     Past Surgical History:  Procedure Laterality Date  . BACK SURGERY    . CARDIAC CATHETERIZATION  1998   stent to left circumflex  . CARDIAC CATHETERIZATION  2005   stent widely patent  . CARPAL TUNNEL RELEASE    . COLONOSCOPY    . KNEE ARTHROSCOPY Left   . SHOULDER SURGERY    . TONSILLECTOMY AND ADENOIDECTOMY     age 27        Home Medications    Prior to Admission medications   Medication Sig Start Date End Date Taking? Authorizing Provider  amLODipine (NORVASC) 2.5 MG tablet Take 2.5 mg by mouth daily.      [provider]  apixaban (ELIQUIS) 5 MG TABS tablet Take 1 tablet (5 mg total) by mouth 2 (two) times daily. 10/09/16   Martinique, Peter M, MD  azelastine (ASTELIN) 137 MCG/SPRAY nasal spray Place 1 spray into the nose 2 (two) times daily.     [provider]  benazepril (LOTENSIN) 40 MG tablet Take 40 mg by mouth daily.      [provider]  busPIRone (BUSPAR) 5 MG tablet Take 5 mg by mouth 2 (two) times daily. 03/04/15   [provider]  cetirizine (ZYRTEC) 5 MG tablet Take 5 mg by mouth daily.      [provider]  Coenzyme Q10 (CO Q 10 PO) Take 1 capsule by mouth daily.     [provider]  COLCRYS 0.6 MG tablet Take 1 tablet by mouth 2 (two) times daily as needed (gout).  01/07/15   [provider]  diazepam (VALIUM) 2 MG tablet Take 2 mg by mouth daily as needed (sleep).  [provider]  Omega-3 Fatty Acids (FISH OIL PO) Take 1 capsule by mouth daily.     [provider]  Omeprazole (PRILOSEC PO) Take 1 capsule by mouth daily.     [provider]  simvastatin (ZOCOR) 40 MG tablet Take 40 mg by mouth at bedtime.      [provider]  traMADol (ULTRAM) 50 MG tablet Take 50 mg by mouth every 12 (twelve) hours as needed (pain).  04/25/14   [provider]    Family History Family History  Problem Relation Age of Onset  . Dementia Mother   . Hypertension Father   . Prostate cancer Father   . Suicidality Father   . Cervical cancer Sister   . Alcohol abuse Sister     Social History Social History  Substance Use Topics  . Smoking status: Former Smoker    Types: Cigarettes  . Smokeless tobacco: Never Used  . Alcohol use No     Allergies   Penicillins   Review of  Systems Review of Systems  Constitutional: Negative for chills, diaphoresis, fatigue and fever.  HENT: Negative for congestion, sore throat, trouble swallowing and voice change.   Respiratory: Positive for shortness of breath (mild). Negative for cough, choking, chest tightness, wheezing and stridor.   Cardiovascular: Negative for chest pain.  Gastrointestinal: Negative for abdominal pain, diarrhea, nausea and vomiting.  Genitourinary: Negative for flank pain.  Musculoskeletal: Negative for back pain, neck pain and neck stiffness.  Skin: Positive for itching and rash.  Neurological: Negative for light-headedness and headaches.  Psychiatric/Behavioral: Negative for agitation.  All other systems reviewed and are negative.    Physical Exam Updated Vital Signs BP (!) 161/113 (BP Location: Right Arm)   Pulse 85   Temp 98.5 F (36.9 C) (Oral)   Resp 20   SpO2 98%   Physical Exam  Constitutional: He appears well-developed and well-nourished. No distress.  HENT:  Head: Normocephalic.  Mouth/Throat: Oropharynx is clear and moist. No oropharyngeal exudate.  Eyes: Pupils are equal, round, and reactive to light. Conjunctivae and EOM are normal.  Neck: Normal range of motion.  No stridor. No lip or tongue swelling. Uvula midline.  Cardiovascular: Normal rate and intact distal pulses.   No murmur heard. Pulmonary/Chest: Effort normal and breath sounds normal. No stridor. No respiratory distress. He has no wheezes. He has no rales. He exhibits no tenderness.  Abdominal: Soft. Bowel sounds are normal. He exhibits no distension.  Musculoskeletal: He exhibits no tenderness.  Neurological: He is alert. No sensory deficit. He exhibits normal muscle tone.  Skin: Capillary refill takes less than 2 seconds. Rash noted. Rash is urticarial. He is not diaphoretic. No erythema.     Psychiatric: He has a normal mood and affect.  Nursing note and vitals reviewed.    ED Treatments / Results   Labs (all labs ordered are listed, but only abnormal results are displayed) Labs Reviewed - No data to display  EKG  EKG Interpretation None       Radiology No results found.  Procedures Procedures (including critical care time)  Medications Ordered in ED Medications  diphenhydrAMINE (BENADRYL) injection 25 mg (25 mg Intravenous Given 11/26/16 2052)  methylPREDNISolone sodium succinate (SOLU-MEDROL) 125 mg/2 mL injection 125 mg (125 mg Intravenous Given 11/26/16 2052)  famotidine (PEPCID) IVPB 20 mg premix (0 mg Intravenous Stopped 11/26/16 2157)     Initial Impression / Assessment and Plan / ED Course  I have reviewed the triage vital  signs and the nursing notes.  Pertinent labs & imaging results that were available during my care of the patient were reviewed by me and considered in my medical decision making (see chart for details).     Christopher Bradford is a 66 y.o. male with a past medical history significant for CAD, atrial fibrillation on blood thinners, and hypertension who presents with allergic reaction from yellow jacket stings. Patient says that he was out in his backyard when he had several yellow jacket stings. He says that he has never been allergic to medications, foods, or insects in the past. He says that she will thereafter, he developed a rash spreading from his arms to the rest of his body. He reports feeling shortness of breath and took Benadryl. This did not help and he presents for evaluation.  Patient denies any lip or tongue swelling. He denies any difficulty swallowing. He denies syncope, nausea, vomiting, or lightheadedness. He denies any other symptoms.  On exam, patient had no wheezing. No stridor. No facial or oral swelling. Patient had diffuse urticarial and erythematous rash on extremities and torso.  Due to rash and sensation of difficulty breathing, patient given steroids and antihistamines. With no stridor, wheezing, or visible oral swelling,  epinephrine was initially held.   Patient reported complete resolution of his allergic symptoms after medications and observation.  Patient reported feeling much better after his initial medications. Do not feel patient needs epinephrine at this time. Patient will however be given prescription for EpiPen at discharge. Patient also instructed to continue taking Benadryl scheduled and take steroids for the next few days. Patient instructed to follow-up with PCP for further allergy testing. Patient given strict return precautions.  Patient had no other questions or concerns and was discharged in good condition with improvement in his allergic reaction symptoms.          Final Clinical Impressions(s) / ED Diagnoses   Final diagnoses:  Allergic reaction, initial encounter  Yellow jacket sting, accidental or unintentional, initial encounter    New Prescriptions New Prescriptions   EPINEPHRINE 0.3 MG/0.3 ML IJ SOAJ INJECTION    Inject 0.3 mLs (0.3 mg total) into the muscle once.   PREDNISONE (DELTASONE) 10 MG TABLET    Take 2 tablets (20 mg total) by mouth daily.    Clinical Impression: 1. Allergic reaction, initial encounter   2. Yellow jacket sting, accidental or unintentional, initial encounter     Disposition: Discharge  Condition: Good  I have discussed the results, Dx and Tx plan with the pt(& family if present). He/she/they expressed understanding and agree(s) with the plan. Discharge instructions discussed at great length. Strict return precautions discussed and pt &/or family have verbalized understanding of the instructions. No further questions at time of discharge.    New Prescriptions   EPINEPHRINE 0.3 MG/0.3 ML IJ SOAJ INJECTION    Inject 0.3 mLs (0.3 mg total) into the muscle once.   PREDNISONE (DELTASONE) 10 MG TABLET    Take 2 tablets (20 mg total) by mouth daily.    Follow Up: [name], MD [place] [address] [place]  [address] [phone]  Schedule an appointment as soon as possible for a visit    [facility] DEPT [place] [id number] [place]  If symptoms worsen  \   Marieme Mcmackin, Gwenyth Allegra, MD 11/27/16 [phone]

## 2016-11-26 NOTE — Discharge Instructions (Signed)
You had an anaphylactic reaction from your stings today. He received steroids and antihistamines. Since you are feeling better and continued to improve, we feel you're safe for discharge. Please take the steroids for the next 5 days. Please filly prescription for the epinephrine to use if you have respiratory distress. Please continue to take over-the-counter Benadryl scheduled for the next 5 days. Please follow-up with her primary care physician for further management and allergy testing. If any symptoms change or worsen, please return to the nearest emergency department.

## 2016-12-28 DIAGNOSIS — G4733 Obstructive sleep apnea (adult) (pediatric): Secondary | ICD-10-CM | POA: Diagnosis not present

## 2016-12-28 DIAGNOSIS — Z1389 Encounter for screening for other disorder: Secondary | ICD-10-CM | POA: Diagnosis not present

## 2016-12-28 DIAGNOSIS — Z0000 Encounter for general adult medical examination without abnormal findings: Secondary | ICD-10-CM | POA: Diagnosis not present

## 2016-12-28 DIAGNOSIS — I10 Essential (primary) hypertension: Secondary | ICD-10-CM | POA: Diagnosis not present

## 2016-12-28 DIAGNOSIS — N400 Enlarged prostate without lower urinary tract symptoms: Secondary | ICD-10-CM | POA: Diagnosis not present

## 2016-12-28 DIAGNOSIS — K5289 Other specified noninfective gastroenteritis and colitis: Secondary | ICD-10-CM | POA: Diagnosis not present

## 2016-12-28 DIAGNOSIS — M1990 Unspecified osteoarthritis, unspecified site: Secondary | ICD-10-CM | POA: Diagnosis not present

## 2016-12-28 DIAGNOSIS — I2510 Atherosclerotic heart disease of native coronary artery without angina pectoris: Secondary | ICD-10-CM | POA: Diagnosis not present

## 2016-12-28 DIAGNOSIS — F419 Anxiety disorder, unspecified: Secondary | ICD-10-CM | POA: Diagnosis not present

## 2016-12-28 DIAGNOSIS — K219 Gastro-esophageal reflux disease without esophagitis: Secondary | ICD-10-CM | POA: Diagnosis not present

## 2016-12-28 DIAGNOSIS — I4891 Unspecified atrial fibrillation: Secondary | ICD-10-CM | POA: Diagnosis not present

## 2016-12-28 DIAGNOSIS — E782 Mixed hyperlipidemia: Secondary | ICD-10-CM | POA: Diagnosis not present

## 2017-01-06 DIAGNOSIS — H0012 Chalazion right lower eyelid: Secondary | ICD-10-CM | POA: Diagnosis not present

## 2017-01-06 DIAGNOSIS — H1859 Other hereditary corneal dystrophies: Secondary | ICD-10-CM | POA: Diagnosis not present

## 2017-01-06 DIAGNOSIS — H18831 Recurrent erosion of cornea, right eye: Secondary | ICD-10-CM | POA: Diagnosis not present

## 2017-01-06 DIAGNOSIS — H10413 Chronic giant papillary conjunctivitis, bilateral: Secondary | ICD-10-CM | POA: Diagnosis not present

## 2017-01-14 ENCOUNTER — Other Ambulatory Visit: Payer: Self-pay | Admitting: Podiatry

## 2017-01-14 ENCOUNTER — Encounter: Payer: Self-pay | Admitting: Podiatry

## 2017-01-14 ENCOUNTER — Ambulatory Visit (INDEPENDENT_AMBULATORY_CARE_PROVIDER_SITE_OTHER): Payer: Medicare Other | Admitting: Podiatry

## 2017-01-14 ENCOUNTER — Ambulatory Visit (INDEPENDENT_AMBULATORY_CARE_PROVIDER_SITE_OTHER): Payer: Medicare Other

## 2017-01-14 DIAGNOSIS — M779 Enthesopathy, unspecified: Secondary | ICD-10-CM

## 2017-01-14 DIAGNOSIS — M79673 Pain in unspecified foot: Secondary | ICD-10-CM

## 2017-01-14 DIAGNOSIS — I259 Chronic ischemic heart disease, unspecified: Secondary | ICD-10-CM

## 2017-01-14 DIAGNOSIS — M205X2 Other deformities of toe(s) (acquired), left foot: Secondary | ICD-10-CM | POA: Diagnosis not present

## 2017-01-14 MED ORDER — TRIAMCINOLONE ACETONIDE 10 MG/ML IJ SUSP
10.0000 mg | Freq: Once | INTRAMUSCULAR | Status: AC
  Administered 2017-01-14: 10 mg

## 2017-01-14 NOTE — Progress Notes (Signed)
Subjective:    Patient ID: Christopher Bradford, male   DOB: 66 y.o.   MRN: [id number]   HPI patient states she started develop a lot of pain in his left big toe joint over the last several months and it seems like it's worse with ambulation or wearing tight shoe gear    ROS      Objective:  Physical Exam neurovascular status intact negative Homan sign was noted with patient's left first MPJ showing soreness with irritation of the joint and mild reduction range of motion with no crepitus noted     Assessment:   Inflammatory capsulitis left first MPJ with hallux limitus condition as a precipitating factor     Plan:     H&P both conditions reviewed and we discussed the possibility at one point this may require surgical intervention with osteotomy and removal of spurring. Today I injected the first MPJ 3 mg Kenalog 5 mg Xylocaine and advised on wider shoe gear and rigid bottom shoes. Reappoint as needed  X-rays indicate that there is no numbness of the lateral side of the joint surface left with mild spurring of the dorsal surface

## 2017-01-20 DIAGNOSIS — M170 Bilateral primary osteoarthritis of knee: Secondary | ICD-10-CM | POA: Diagnosis not present

## 2017-01-20 DIAGNOSIS — H10413 Chronic giant papillary conjunctivitis, bilateral: Secondary | ICD-10-CM | POA: Diagnosis not present

## 2017-01-20 DIAGNOSIS — H18831 Recurrent erosion of cornea, right eye: Secondary | ICD-10-CM | POA: Diagnosis not present

## 2017-01-20 DIAGNOSIS — H0012 Chalazion right lower eyelid: Secondary | ICD-10-CM | POA: Diagnosis not present

## 2017-01-20 DIAGNOSIS — H1859 Other hereditary corneal dystrophies: Secondary | ICD-10-CM | POA: Diagnosis not present

## 2017-02-11 DIAGNOSIS — Z23 Encounter for immunization: Secondary | ICD-10-CM | POA: Diagnosis not present

## 2017-03-26 ENCOUNTER — Encounter: Payer: Self-pay | Admitting: Cardiology

## 2017-04-03 NOTE — Progress Notes (Signed)
Cardiology Office Note   Date:  04/06/2017   ID:  Christopher Bradford, DOB 12/20/1950, MRN [id number]  PCP:  [name], MD  Cardiologist: Lenis Nettleton Martinique MD  Chief Complaint  Patient presents with  . Atrial Fibrillation  . Coronary Artery Disease      History of Present Illness: Christopher Bradford is a 66 y.o. male who presents for follow-up CAD and atrial fibrillation.   The patient has a past history of ischemic heart disease. He has a past history of a stent to the circumflex in 1998. He had a cardiac catheterization in 2005 showing that the left circumflex stent was widely patent. Patient is on statin therapy. The patient has a history of hypercholesterolemia and essential hypertension and exogenous obesity. He is also the primary caregiver for his wife who has a lot of chronic medical problems.  In September 2016 he was found to be in atrial fibrillation which was asymptomatic.He had presented to his PCP for a physical on 01/28/15 and was incidentally noted to be in rate-controlled atrial fibrillation.  Aspirin was discontinued and he was started on Eliquis 5mg  BID (CHADSVASC = 3).  His heart rate was already well controlled and he did not require any additional A-V node blocking medication. He has not had any stroke or TIA symptoms. Echo 02/14/15 showed normal LV function. Valvular function was good. The LA was severely enlarged and the RA was moderately enlarged.   On follow up today he is doing OK.  Exercise is limited by severe arthritis in his knees. He has had a number of stressors this year. In October he was under a lot of stress and did develop substernal chest tightness and tingling in his arm. He did not seek attention and symptoms had abated by the following day. Limited in activity due to arthritis. Just does some yard work. He has gained 6 lbs.   Past Medical History:  Diagnosis Date  . Anxiety   . Atrial fibrillation ([place])   . BPH (benign prostatic hyperplasia)    . CAD (coronary artery disease)    a. s/p stent to Cx 1998. b. Stent (patent by [facility] 2005 with otherwise mild luminal irreg, normal EF at that time.  . Depression   . GERD (gastroesophageal reflux disease)   . History of gout   . Hyperlipidemia   . Hyperplastic colon polyp   . Hypertension   . Obesity   . OSA (obstructive sleep apnea)   . Osteoarthritis     Past Surgical History:  Procedure Laterality Date  . BACK SURGERY    . CARDIAC CATHETERIZATION  1998   stent to left circumflex  . CARDIAC CATHETERIZATION  2005   stent widely patent  . CARPAL TUNNEL RELEASE    . COLONOSCOPY    . KNEE ARTHROSCOPY Left   . SHOULDER SURGERY    . TONSILLECTOMY AND ADENOIDECTOMY     age 13     Current Outpatient Medications  Medication Sig Dispense Refill  . amLODipine (NORVASC) 2.5 MG tablet Take 2.5 mg by mouth daily.      Marland Kitchen apixaban (ELIQUIS) 5 MG TABS tablet Take 1 tablet (5 mg total) by mouth 2 (two) times daily. 180 tablet 1  . azelastine (ASTELIN) 137 MCG/SPRAY nasal spray Place 1 spray into the nose 2 (two) times daily as needed for allergies.     . benazepril (LOTENSIN) 40 MG tablet Take 40 mg by mouth daily.      . busPIRone (  BUSPAR) 7.5 MG tablet Take 1 tablet by mouth every morning.  3  . cetirizine (ZYRTEC) 5 MG tablet Take 5 mg by mouth daily.      . Coenzyme Q10 (CO Q 10 PO) Take 1 capsule by mouth daily.     . diazepam (VALIUM) 2 MG tablet Take 2 mg by mouth daily as needed (sleep).     . diphenhydrAMINE (BENADRYL) 25 MG tablet Take 25 mg by mouth every 6 (six) hours as needed for itching or allergies.    . fluticasone (FLONASE) 50 MCG/ACT nasal spray Place 1 spray into both nostrils daily as needed for allergies or rhinitis.    . Omega-3 Fatty Acids (FISH OIL PO) Take 1 capsule by mouth daily.     . Omeprazole (PRILOSEC PO) Take 20 mg by mouth daily.     . predniSONE (DELTASONE) 10 MG tablet Take 2 tablets (20 mg total) by mouth daily. 15 tablet 0  . simvastatin (ZOCOR) 40  MG tablet Take 40 mg by mouth daily.     . traMADol (ULTRAM) 50 MG tablet Take 50 mg by mouth every 12 (twelve) hours as needed (pain).   0  . XIIDRA 5 % SOLN Place 1 drop into both eyes daily as needed (dry eyes).   0   No current facility-administered medications for this visit.     Allergies:   Penicillins    Social History:  The patient  reports that he has quit smoking. His smoking use included cigarettes. he has never used smokeless tobacco. He reports that he does not drink alcohol or use drugs.   Family History:  The patient's family history includes Alcohol abuse in his sister; Cervical cancer in his sister; Dementia in his mother; Hypertension in his father; Prostate cancer in his father; Suicidality in his father.    ROS:  Please see the history of present illness.   Otherwise, review of systems are positive for none.   All other systems are reviewed and negative.    PHYSICAL EXAM: VS:  BP 115/82   Pulse 89   Ht 5\' 8"  (1.727 m)   Wt 235 lb 9.6 oz (106.9 kg)   BMI 35.82 kg/m  , BMI Body mass index is 35.82 kg/m. GENERAL:  Well appearing WM in NAD HEENT:  PERRL, EOMI, sclera are clear. Oropharynx is clear. NECK:  No jugular venous distention, carotid upstroke brisk and symmetric, no bruits, no thyromegaly or adenopathy LUNGS:  Clear to auscultation bilaterally CHEST:  Unremarkable HEART:  RRR,  PMI not displaced or sustained,S1 and S2 within normal limits, no S3, no S4: no clicks, no rubs, no murmurs ABD:  Soft, nontender. BS +, no masses or bruits. No hepatomegaly, no splenomegaly EXT:  2 + pulses throughout, no edema, no cyanosis no clubbing. Arthritic changes in knees. SKIN:  Warm and dry.  No rashes NEURO:  Alert and oriented x 3. Cranial nerves II through XII intact. PSYCH:  Cognitively intact     EKG:  EKG is  ordered today. Afib with rate 89. Otherwise normal. I have personally reviewed and interpreted this study.    Recent Labs: No results found for  requested labs within last 8760 hours.    Lipid Panel No results found for: CHOL, TRIG, HDL, CHOLHDL, VLDL, LDLCALC, LDLDIRECT    Wt Readings from Last 3 Encounters:  04/06/17 235 lb 9.6 oz (106.9 kg)  10/08/16 229 lb 9.6 oz (104.1 kg)  04/30/16 223 lb (101.2 kg)  Labs reviewed from primary care dated 01/29/16: cholesterol 145, triglycerides 103, LDL 73, HDL 52.  CMET normal.  Dated 12/28/16: cholesterol 131, triglycerides 83, HDL 49, LDL 66. CBC and chemistries normal.    ASSESSMENT AND PLAN:  1.  Ischemic heart disease with previous stent to circumflex coronary artery in 1998. Cardiac catheterization in 2005 showed that the stent was widely patent. No recent ischemic evaluation. Episode of angina in October with multiple stressors.  Now angina free.  Continue medical therapy. We will arrange a Lexiscan myoview in early 2019 at his request.  2. Essential hypertension- controlled.  3. Obesity 4. Hypercholesterolemia- well controlled on statin. 5. Osteoarthritis of bilateral knees 6.  Permanent  atrial fibrillation- rate is well controlled on no AV nodal blocking agents. He is asymptomatic. Continue  anticoagulation with Eliquis.   Disposition: Continue current medication.follow up in 6 months.   Current medicines are reviewed at length with the patient today.  The patient does not have concerns regarding medicines.  The following changes have been made:  no change  Labs/ tests ordered today include:   No orders of the defined types were placed in this encounter.    Disposition: Follow up in 6 months.  Signed, Kally Cadden Martinique MD, [facility]  04/06/2017 3:19 PM

## 2017-04-05 DIAGNOSIS — H43811 Vitreous degeneration, right eye: Secondary | ICD-10-CM | POA: Diagnosis not present

## 2017-04-05 DIAGNOSIS — H1859 Other hereditary corneal dystrophies: Secondary | ICD-10-CM | POA: Diagnosis not present

## 2017-04-05 DIAGNOSIS — H2513 Age-related nuclear cataract, bilateral: Secondary | ICD-10-CM | POA: Diagnosis not present

## 2017-04-06 ENCOUNTER — Ambulatory Visit (INDEPENDENT_AMBULATORY_CARE_PROVIDER_SITE_OTHER): Payer: Medicare Other | Admitting: Cardiology

## 2017-04-06 ENCOUNTER — Encounter: Payer: Self-pay | Admitting: Cardiology

## 2017-04-06 ENCOUNTER — Other Ambulatory Visit: Payer: Self-pay

## 2017-04-06 VITALS — BP 115/82 | HR 89 | Ht 68.0 in | Wt 235.6 lb

## 2017-04-06 DIAGNOSIS — R079 Chest pain, unspecified: Secondary | ICD-10-CM | POA: Diagnosis not present

## 2017-04-06 DIAGNOSIS — I259 Chronic ischemic heart disease, unspecified: Secondary | ICD-10-CM | POA: Diagnosis not present

## 2017-04-06 DIAGNOSIS — Z9861 Coronary angioplasty status: Secondary | ICD-10-CM

## 2017-04-06 DIAGNOSIS — I482 Chronic atrial fibrillation, unspecified: Secondary | ICD-10-CM

## 2017-04-06 DIAGNOSIS — I10 Essential (primary) hypertension: Secondary | ICD-10-CM | POA: Diagnosis not present

## 2017-04-06 DIAGNOSIS — I2510 Atherosclerotic heart disease of native coronary artery without angina pectoris: Secondary | ICD-10-CM | POA: Diagnosis not present

## 2017-04-06 DIAGNOSIS — E785 Hyperlipidemia, unspecified: Secondary | ICD-10-CM | POA: Diagnosis not present

## 2017-04-06 NOTE — Addendum Note (Signed)
Addended by: Kathyrn Lass on: 04/06/2017 03:28 PM   Modules accepted: Orders

## 2017-04-06 NOTE — Patient Instructions (Signed)
We will schedule you for a nuclear stress test at the beginning of the year  Continue your current therapy  I will see you in 6 months

## 2017-04-13 ENCOUNTER — Other Ambulatory Visit: Payer: Self-pay | Admitting: *Deleted

## 2017-04-13 MED ORDER — APIXABAN 5 MG PO TABS: 5.0000 mg | ORAL_TABLET | Freq: Two times a day (BID) | ORAL | 1 refills | Status: DC

## 2017-05-19 DIAGNOSIS — M13861 Other specified arthritis, right knee: Secondary | ICD-10-CM | POA: Diagnosis not present

## 2017-05-19 DIAGNOSIS — M13862 Other specified arthritis, left knee: Secondary | ICD-10-CM | POA: Diagnosis not present

## 2017-05-27 ENCOUNTER — Telehealth (HOSPITAL_COMMUNITY): Payer: Self-pay

## 2017-05-27 NOTE — Telephone Encounter (Signed)
Encounter complete. 

## 2017-06-01 ENCOUNTER — Encounter (INDEPENDENT_AMBULATORY_CARE_PROVIDER_SITE_OTHER): Payer: Self-pay

## 2017-06-01 ENCOUNTER — Ambulatory Visit (HOSPITAL_COMMUNITY)
Admission: RE | Admit: 2017-06-01 | Discharge: 2017-06-01 | Disposition: A | Discharge status: Home / Self Care | Payer: Medicare Other | Source: Ambulatory Visit | Attending: Cardiovascular Disease | Admitting: Cardiovascular Disease

## 2017-06-01 DIAGNOSIS — Z6835 Body mass index (BMI) 35.0-35.9, adult: Secondary | ICD-10-CM | POA: Insufficient documentation

## 2017-06-01 DIAGNOSIS — I482 Chronic atrial fibrillation, unspecified: Secondary | ICD-10-CM

## 2017-06-01 DIAGNOSIS — I10 Essential (primary) hypertension: Secondary | ICD-10-CM | POA: Diagnosis not present

## 2017-06-01 DIAGNOSIS — G4733 Obstructive sleep apnea (adult) (pediatric): Secondary | ICD-10-CM | POA: Insufficient documentation

## 2017-06-01 DIAGNOSIS — Z87891 Personal history of nicotine dependence: Secondary | ICD-10-CM | POA: Insufficient documentation

## 2017-06-01 DIAGNOSIS — Z9861 Coronary angioplasty status: Secondary | ICD-10-CM | POA: Insufficient documentation

## 2017-06-01 DIAGNOSIS — E785 Hyperlipidemia, unspecified: Secondary | ICD-10-CM | POA: Diagnosis not present

## 2017-06-01 DIAGNOSIS — I2510 Atherosclerotic heart disease of native coronary artery without angina pectoris: Secondary | ICD-10-CM | POA: Insufficient documentation

## 2017-06-01 DIAGNOSIS — R079 Chest pain, unspecified: Secondary | ICD-10-CM | POA: Diagnosis not present

## 2017-06-01 DIAGNOSIS — E669 Obesity, unspecified: Secondary | ICD-10-CM | POA: Insufficient documentation

## 2017-06-01 LAB — MYOCARDIAL PERFUSION IMAGING
CHL CUP NUCLEAR SDS: 1
LVDIAVOL: 142 mL (ref 62–150)
LVSYSVOL: 76 mL
NUC STRESS TID: 1.15
Peak HR: 88 {beats}/min
Rest HR: 72 {beats}/min
SRS: 0
SSS: 1

## 2017-06-01 MED ORDER — TECHNETIUM TC 99M TETROFOSMIN IV KIT
30.6000 | PACK | Freq: Once | INTRAVENOUS | Status: AC | PRN
  Administered 2017-06-01: 30.6 via INTRAVENOUS
  Filled 2017-06-01: qty 31

## 2017-06-01 MED ORDER — REGADENOSON 0.4 MG/5ML IV SOLN
0.4000 mg | Freq: Once | INTRAVENOUS | Status: AC
  Administered 2017-06-01: 0.4 mg via INTRAVENOUS

## 2017-06-01 MED ORDER — TECHNETIUM TC 99M TETROFOSMIN IV KIT
10.9000 | PACK | Freq: Once | INTRAVENOUS | Status: AC | PRN
  Administered 2017-06-01: 10.9 via INTRAVENOUS
  Filled 2017-06-01: qty 11

## 2017-06-30 DIAGNOSIS — L570 Actinic keratosis: Secondary | ICD-10-CM | POA: Diagnosis not present

## 2017-06-30 DIAGNOSIS — L821 Other seborrheic keratosis: Secondary | ICD-10-CM | POA: Diagnosis not present

## 2017-06-30 DIAGNOSIS — D225 Melanocytic nevi of trunk: Secondary | ICD-10-CM | POA: Diagnosis not present

## 2017-06-30 DIAGNOSIS — L918 Other hypertrophic disorders of the skin: Secondary | ICD-10-CM | POA: Diagnosis not present

## 2017-06-30 DIAGNOSIS — D692 Other nonthrombocytopenic purpura: Secondary | ICD-10-CM | POA: Diagnosis not present

## 2017-06-30 DIAGNOSIS — D224 Melanocytic nevi of scalp and neck: Secondary | ICD-10-CM | POA: Diagnosis not present

## 2017-06-30 DIAGNOSIS — L720 Epidermal cyst: Secondary | ICD-10-CM | POA: Diagnosis not present

## 2017-06-30 DIAGNOSIS — D1801 Hemangioma of skin and subcutaneous tissue: Secondary | ICD-10-CM | POA: Diagnosis not present

## 2017-07-12 DIAGNOSIS — G4733 Obstructive sleep apnea (adult) (pediatric): Secondary | ICD-10-CM | POA: Diagnosis not present

## 2017-07-12 DIAGNOSIS — K5289 Other specified noninfective gastroenteritis and colitis: Secondary | ICD-10-CM | POA: Diagnosis not present

## 2017-07-12 DIAGNOSIS — F419 Anxiety disorder, unspecified: Secondary | ICD-10-CM | POA: Diagnosis not present

## 2017-07-12 DIAGNOSIS — Z1389 Encounter for screening for other disorder: Secondary | ICD-10-CM | POA: Diagnosis not present

## 2017-07-12 DIAGNOSIS — E782 Mixed hyperlipidemia: Secondary | ICD-10-CM | POA: Diagnosis not present

## 2017-07-12 DIAGNOSIS — I2510 Atherosclerotic heart disease of native coronary artery without angina pectoris: Secondary | ICD-10-CM | POA: Diagnosis not present

## 2017-07-12 DIAGNOSIS — I10 Essential (primary) hypertension: Secondary | ICD-10-CM | POA: Diagnosis not present

## 2017-07-12 DIAGNOSIS — E669 Obesity, unspecified: Secondary | ICD-10-CM | POA: Diagnosis not present

## 2017-07-12 DIAGNOSIS — F339 Major depressive disorder, recurrent, unspecified: Secondary | ICD-10-CM | POA: Diagnosis not present

## 2017-07-28 ENCOUNTER — Ambulatory Visit: Payer: Medicare Other | Admitting: Podiatry

## 2017-07-30 ENCOUNTER — Ambulatory Visit (INDEPENDENT_AMBULATORY_CARE_PROVIDER_SITE_OTHER): Payer: Medicare Other | Admitting: Podiatry

## 2017-07-30 ENCOUNTER — Encounter: Payer: Self-pay | Admitting: Podiatry

## 2017-07-30 DIAGNOSIS — L84 Corns and callosities: Secondary | ICD-10-CM

## 2017-08-02 NOTE — Progress Notes (Signed)
Subjective:   Patient ID: Christopher Bradford, male   DOB: 67 y.o.   MRN: [id number]   HPI Patient presents with painful lesion underneath the left fifth metatarsal and makes it hard to walk   ROS      Objective:  Physical Exam  Neurovascular status intact with keratotic lesion plantar left fifth metatarsal that is painful     Assessment:  Chronic lesion secondary to structural change     Plan:  Sterile debridement of lesion accomplished with no iatrogenic bleeding and reappoint to recheck

## 2017-08-27 ENCOUNTER — Ambulatory Visit (INDEPENDENT_AMBULATORY_CARE_PROVIDER_SITE_OTHER): Payer: Medicare Other

## 2017-08-27 ENCOUNTER — Ambulatory Visit: Payer: Medicare Other

## 2017-08-27 ENCOUNTER — Encounter: Payer: Self-pay | Admitting: Podiatry

## 2017-08-27 ENCOUNTER — Other Ambulatory Visit: Payer: Self-pay | Admitting: Podiatry

## 2017-08-27 ENCOUNTER — Ambulatory Visit (INDEPENDENT_AMBULATORY_CARE_PROVIDER_SITE_OTHER): Payer: Medicare Other | Admitting: Podiatry

## 2017-08-27 DIAGNOSIS — M1000 Idiopathic gout, unspecified site: Secondary | ICD-10-CM | POA: Diagnosis not present

## 2017-08-27 DIAGNOSIS — Z9861 Coronary angioplasty status: Secondary | ICD-10-CM

## 2017-08-27 DIAGNOSIS — M779 Enthesopathy, unspecified: Secondary | ICD-10-CM

## 2017-08-27 DIAGNOSIS — S99922A Unspecified injury of left foot, initial encounter: Secondary | ICD-10-CM

## 2017-08-27 DIAGNOSIS — I2510 Atherosclerotic heart disease of native coronary artery without angina pectoris: Secondary | ICD-10-CM | POA: Diagnosis not present

## 2017-08-27 DIAGNOSIS — M2011 Hallux valgus (acquired), right foot: Secondary | ICD-10-CM

## 2017-08-27 MED ORDER — TRIAMCINOLONE ACETONIDE 10 MG/ML IJ SUSP
10.0000 mg | Freq: Once | INTRAMUSCULAR | Status: AC
  Administered 2017-08-27: 10 mg

## 2017-08-29 NOTE — Progress Notes (Signed)
Subjective:   Patient ID: Christopher Bradford, male   DOB: 67 y.o.   MRN: [id number]   HPI Patient states he traumatized his left foot 4 days ago and it became inflamed and now is really sore and he does have a history of gout which is not been present for fairly long time   ROS      Objective:  Physical Exam  Neurovascular status intact with inflammation around the first MPJ left with redness and pain of a moderate nature with quite a bit of discomfort in the joint and mild structural changes     Assessment:  Probability for inflammatory capsulitis left which may be related to bunion or it may be related to gout     Plan:  H&P condition reviewed and I did give him instructions on gout diet and today I went ahead and injected the left first MPJ 3 mg Kenalog 5 mg Xylocaine which was tolerated well and I advised on wider shoes  X-rays indicate that there is mild structural bunion deformity and no other pathology noted

## 2017-09-27 DIAGNOSIS — R05 Cough: Secondary | ICD-10-CM | POA: Diagnosis not present

## 2017-09-27 DIAGNOSIS — J208 Acute bronchitis due to other specified organisms: Secondary | ICD-10-CM | POA: Diagnosis not present

## 2017-10-13 ENCOUNTER — Ambulatory Visit: Payer: Medicare Other | Admitting: Cardiology

## 2017-10-18 NOTE — Progress Notes (Signed)
Cardiology Office Note   Date:  10/20/2017   ID:  Christopher Bradford, DOB 11/25/1950, MRN [id number]  PCP:  [name], MD  Cardiologist: Anai Lipson Martinique MD  Chief Complaint  Patient presents with  . Coronary Artery Disease  . Atrial Fibrillation      History of Present Illness: Christopher Bradford is a 67 y.o. male who presents for follow-up CAD and atrial fibrillation.   The patient has a past history of ischemic heart disease. He has a past history of a stent to the circumflex in 1998. He had a cardiac catheterization in 2005 showing that the left circumflex stent was widely patent. Patient is on statin therapy. The patient has a history of hypercholesterolemia and essential hypertension and exogenous obesity. He is also the primary caregiver for his wife who has a lot of chronic medical problems.  In September 2016 he was found to be in atrial fibrillation which was asymptomatic.He had presented to his PCP for a physical on 01/28/15 and was incidentally noted to be in rate-controlled atrial fibrillation.  Aspirin was discontinued and he was started on Eliquis 5mg  BID (CHADSVASC = 3).  His heart rate was already well controlled and he did not require any additional A-V node blocking medication. He has not had any stroke or TIA symptoms. Echo 02/14/15 showed normal LV function. Valvular function was good. The LA was severely enlarged and the RA was moderately enlarged. Myoview study in January 2019 was normal.   On follow up today he is doing OK. States is has been a rough spring. Had bronchitis and a lot of back problems. 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 No cardiac issues. Denies chest pain, dyspnea, palpitations or dizziness. Still quite limited with arthritis. He has gained 6 lbs- states he eats for stress.   Past Medical History:  Diagnosis Date  . Anxiety   . Atrial fibrillation ([place])   . BPH (benign prostatic hyperplasia)   . CAD (coronary artery disease)    a. s/p  stent to Cx 1998. b. Stent (patent by [facility] 2005 with otherwise mild luminal irreg, normal EF at that time.  . Depression   . GERD (gastroesophageal reflux disease)   . History of gout   . Hyperlipidemia   . Hyperplastic colon polyp   . Hypertension   . Obesity   . OSA (obstructive sleep apnea)   . Osteoarthritis     Past Surgical History:  Procedure Laterality Date  . BACK SURGERY    . CARDIAC CATHETERIZATION  1998   stent to left circumflex  . CARDIAC CATHETERIZATION  2005   stent widely patent  . CARPAL TUNNEL RELEASE    . COLONOSCOPY    . KNEE ARTHROSCOPY Left   . SHOULDER SURGERY    . TONSILLECTOMY AND ADENOIDECTOMY     age 62     Current Outpatient Medications  Medication Sig Dispense Refill  . amLODipine (NORVASC) 2.5 MG tablet Take 2.5 mg by mouth daily.      Marland Kitchen apixaban (ELIQUIS) 5 MG TABS tablet Take 1 tablet (5 mg total) by mouth 2 (two) times daily. 180 tablet 1  . azelastine (ASTELIN) 137 MCG/SPRAY nasal spray Place 1 spray into the nose 2 (two) times daily as needed for allergies.     . benazepril (LOTENSIN) 40 MG tablet Take 40 mg by mouth daily.      . busPIRone (BUSPAR) 7.5 MG tablet Take 1 tablet by mouth every morning.  3  .  cetirizine (ZYRTEC) 5 MG tablet Take 5 mg by mouth daily.      . Coenzyme Q10 (CO Q 10 PO) Take 1 capsule by mouth daily.     . diazepam (VALIUM) 2 MG tablet Take 2 mg by mouth daily as needed (sleep).     . diphenhydrAMINE (BENADRYL) 25 MG tablet Take 25 mg by mouth every 6 (six) hours as needed for itching or allergies.    . fluticasone (FLONASE) 50 MCG/ACT nasal spray Place 1 spray into both nostrils daily as needed for allergies or rhinitis.    . Omega-3 Fatty Acids (FISH OIL PO) Take 1 capsule by mouth daily.     . Omeprazole (PRILOSEC PO) Take 20 mg by mouth daily.     . simvastatin (ZOCOR) 40 MG tablet Take 40 mg by mouth daily.     . traMADol (ULTRAM) 50 MG tablet Take 50 mg by mouth every 12 (twelve) hours as needed (pain).   0     No current facility-administered medications for this visit.     Allergies:   Penicillins    Social History:  The patient  reports that he has quit smoking. His smoking use included cigarettes. He has never used smokeless tobacco. He reports that he does not drink alcohol or use drugs.   Family History:  The patient's family history includes Alcohol abuse in his sister; Cervical cancer in his sister; Dementia in his mother; Hypertension in his father; Prostate cancer in his father; Suicidality in his father.    ROS:  Please see the history of present illness.   Otherwise, review of systems are positive for none.   All other systems are reviewed and negative.    PHYSICAL EXAM: VS:  BP 138/89   Pulse 88   Ht 5\' 9"  (1.753 m)   Wt 242 lb 9.6 oz (110 kg)   SpO2 96%   BMI 35.83 kg/m  , BMI Body mass index is 35.83 kg/m. GENERAL:  Well appearing WM in NAD HEENT:  PERRL, EOMI, sclera are clear. Oropharynx is clear. NECK:  No jugular venous distention, carotid upstroke brisk and symmetric, no bruits, no thyromegaly or adenopathy LUNGS:  Clear to auscultation bilaterally CHEST:  Unremarkable HEART:  RRR,  PMI not displaced or sustained,S1 and S2 within normal limits, no S3, no S4: no clicks, no rubs, no murmurs ABD:  Soft, nontender. BS +, no masses or bruits. No hepatomegaly, no splenomegaly EXT:  2 + pulses throughout, no edema, no cyanosis no clubbing. Arthritic changes in knees. SKIN:  Warm and dry.  No rashes NEURO:  Alert and oriented x 3. Cranial nerves II through XII intact. PSYCH:  Cognitively intact     EKG:  EKG is  Not ordered today.     Recent Labs: No results found for requested labs within last 8760 hours.    Lipid Panel No results found for: CHOL, TRIG, HDL, CHOLHDL, VLDL, LDLCALC, LDLDIRECT    Wt Readings from Last 3 Encounters:  10/20/17 242 lb 9.6 oz (110 kg)  06/01/17 235 lb (106.6 kg)  04/06/17 235 lb 9.6 oz (106.9 kg)    Labs reviewed from  primary care dated 01/29/16: cholesterol 145, triglycerides 103, LDL 73, HDL 52.  CMET normal.  Dated 12/28/16: cholesterol 131, triglycerides 83, HDL 49, LDL 66. CBC and chemistries normal.   Myoview 06/01/17: Study Highlights     The left ventricular ejection fraction is mildly decreased (45-54%).  Nuclear stress EF: 46%.  There was no  ST segment deviation noted during stress.  The study is normal.  This is a low risk study.   Normal pharmacologic nuclear stress test with no evidence for prior infarct or ischemia.  LVEF appears better than calculated, a correlation with an echocardiogram is recommended.    ASSESSMENT AND PLAN:  1.  CAD s/p previous stent to circumflex coronary artery in 1998. Cardiac catheterization in 2005 showed that the stent was widely patent.  Lexiscan myoview in early 2019 was normal. He is asymptomatic. 2. Essential hypertension- controlled.  3. Obesity 4. Hypercholesterolemia- well controlled on statin. To have follow up lab work this summer with primary care.  5. Osteoarthritis of bilateral knees 6.  Permanent  atrial fibrillation- rate is well controlled on no AV nodal blocking agents. He is asymptomatic. Continue  anticoagulation with Eliquis.   Disposition: Continue current medication.follow up in 6 months.   Current medicines are reviewed at length with the patient today.  The patient does not have concerns regarding medicines.  The following changes have been made:  no change  Labs/ tests ordered today include:   No orders of the defined types were placed in this encounter.  Signed, Adain Geurin Martinique MD, [facility]  10/20/2017 3:14 PM

## 2017-10-20 ENCOUNTER — Encounter: Payer: Self-pay | Admitting: Cardiology

## 2017-10-20 ENCOUNTER — Ambulatory Visit (INDEPENDENT_AMBULATORY_CARE_PROVIDER_SITE_OTHER): Payer: Medicare Other | Admitting: Cardiology

## 2017-10-20 VITALS — BP 138/89 | HR 88 | Ht 69.0 in | Wt 242.6 lb

## 2017-10-20 DIAGNOSIS — I482 Chronic atrial fibrillation, unspecified: Secondary | ICD-10-CM

## 2017-10-20 DIAGNOSIS — I2510 Atherosclerotic heart disease of native coronary artery without angina pectoris: Secondary | ICD-10-CM | POA: Diagnosis not present

## 2017-10-20 DIAGNOSIS — Z9861 Coronary angioplasty status: Secondary | ICD-10-CM

## 2017-10-20 DIAGNOSIS — I10 Essential (primary) hypertension: Secondary | ICD-10-CM | POA: Diagnosis not present

## 2017-10-20 DIAGNOSIS — E785 Hyperlipidemia, unspecified: Secondary | ICD-10-CM

## 2017-10-20 NOTE — Patient Instructions (Signed)
Continue your current therapy  I will see you in 6 months.   

## 2017-11-09 ENCOUNTER — Other Ambulatory Visit: Payer: Self-pay | Admitting: Pharmacist Clinician (PhC)/ Clinical Pharmacy Specialist

## 2017-11-09 MED ORDER — APIXABAN 5 MG PO TABS: 5.0000 mg | ORAL_TABLET | Freq: Two times a day (BID) | ORAL | 1 refills | Status: DC

## 2018-01-03 DIAGNOSIS — F419 Anxiety disorder, unspecified: Secondary | ICD-10-CM | POA: Diagnosis not present

## 2018-01-03 DIAGNOSIS — I2510 Atherosclerotic heart disease of native coronary artery without angina pectoris: Secondary | ICD-10-CM | POA: Diagnosis not present

## 2018-01-03 DIAGNOSIS — I4891 Unspecified atrial fibrillation: Secondary | ICD-10-CM | POA: Diagnosis not present

## 2018-01-03 DIAGNOSIS — J309 Allergic rhinitis, unspecified: Secondary | ICD-10-CM | POA: Diagnosis not present

## 2018-01-03 DIAGNOSIS — F339 Major depressive disorder, recurrent, unspecified: Secondary | ICD-10-CM | POA: Diagnosis not present

## 2018-01-03 DIAGNOSIS — K5289 Other specified noninfective gastroenteritis and colitis: Secondary | ICD-10-CM | POA: Diagnosis not present

## 2018-01-03 DIAGNOSIS — F322 Major depressive disorder, single episode, severe without psychotic features: Secondary | ICD-10-CM | POA: Diagnosis not present

## 2018-01-03 DIAGNOSIS — I10 Essential (primary) hypertension: Secondary | ICD-10-CM | POA: Diagnosis not present

## 2018-01-03 DIAGNOSIS — Z1389 Encounter for screening for other disorder: Secondary | ICD-10-CM | POA: Diagnosis not present

## 2018-01-03 DIAGNOSIS — Z0000 Encounter for general adult medical examination without abnormal findings: Secondary | ICD-10-CM | POA: Diagnosis not present

## 2018-01-03 DIAGNOSIS — M1990 Unspecified osteoarthritis, unspecified site: Secondary | ICD-10-CM | POA: Diagnosis not present

## 2018-01-03 DIAGNOSIS — E782 Mixed hyperlipidemia: Secondary | ICD-10-CM | POA: Diagnosis not present

## 2018-01-03 DIAGNOSIS — N400 Enlarged prostate without lower urinary tract symptoms: Secondary | ICD-10-CM | POA: Diagnosis not present

## 2018-01-18 DIAGNOSIS — I10 Essential (primary) hypertension: Secondary | ICD-10-CM | POA: Diagnosis not present

## 2018-02-03 DIAGNOSIS — Z23 Encounter for immunization: Secondary | ICD-10-CM | POA: Diagnosis not present

## 2018-02-23 DIAGNOSIS — M13862 Other specified arthritis, left knee: Secondary | ICD-10-CM | POA: Diagnosis not present

## 2018-02-23 DIAGNOSIS — M13861 Other specified arthritis, right knee: Secondary | ICD-10-CM | POA: Diagnosis not present

## 2018-02-24 DIAGNOSIS — H18831 Recurrent erosion of cornea, right eye: Secondary | ICD-10-CM | POA: Diagnosis not present

## 2018-02-28 DIAGNOSIS — H538 Other visual disturbances: Secondary | ICD-10-CM | POA: Diagnosis not present

## 2018-02-28 DIAGNOSIS — B0053 Herpesviral conjunctivitis: Secondary | ICD-10-CM | POA: Diagnosis not present

## 2018-02-28 DIAGNOSIS — H20051 Hypopyon, right eye: Secondary | ICD-10-CM | POA: Diagnosis not present

## 2018-02-28 DIAGNOSIS — H18211 Corneal edema secondary to contact lens, right eye: Secondary | ICD-10-CM | POA: Diagnosis not present

## 2018-03-01 DIAGNOSIS — H20051 Hypopyon, right eye: Secondary | ICD-10-CM | POA: Diagnosis not present

## 2018-03-01 DIAGNOSIS — Z79899 Other long term (current) drug therapy: Secondary | ICD-10-CM | POA: Diagnosis not present

## 2018-03-01 DIAGNOSIS — H18831 Recurrent erosion of cornea, right eye: Secondary | ICD-10-CM | POA: Diagnosis not present

## 2018-03-02 DIAGNOSIS — H01025 Squamous blepharitis left lower eyelid: Secondary | ICD-10-CM | POA: Diagnosis not present

## 2018-03-02 DIAGNOSIS — H20051 Hypopyon, right eye: Secondary | ICD-10-CM | POA: Diagnosis not present

## 2018-03-02 DIAGNOSIS — H01021 Squamous blepharitis right upper eyelid: Secondary | ICD-10-CM | POA: Diagnosis not present

## 2018-03-02 DIAGNOSIS — H1811 Bullous keratopathy, right eye: Secondary | ICD-10-CM | POA: Diagnosis not present

## 2018-03-02 DIAGNOSIS — H18831 Recurrent erosion of cornea, right eye: Secondary | ICD-10-CM | POA: Diagnosis not present

## 2018-03-02 DIAGNOSIS — H01022 Squamous blepharitis right lower eyelid: Secondary | ICD-10-CM | POA: Diagnosis not present

## 2018-03-02 DIAGNOSIS — H01024 Squamous blepharitis left upper eyelid: Secondary | ICD-10-CM | POA: Diagnosis not present

## 2018-03-07 DIAGNOSIS — H01021 Squamous blepharitis right upper eyelid: Secondary | ICD-10-CM | POA: Diagnosis not present

## 2018-03-07 DIAGNOSIS — H01025 Squamous blepharitis left lower eyelid: Secondary | ICD-10-CM | POA: Diagnosis not present

## 2018-03-07 DIAGNOSIS — H1811 Bullous keratopathy, right eye: Secondary | ICD-10-CM | POA: Diagnosis not present

## 2018-03-07 DIAGNOSIS — H01022 Squamous blepharitis right lower eyelid: Secondary | ICD-10-CM | POA: Diagnosis not present

## 2018-03-07 DIAGNOSIS — H01024 Squamous blepharitis left upper eyelid: Secondary | ICD-10-CM | POA: Diagnosis not present

## 2018-03-07 DIAGNOSIS — H18831 Recurrent erosion of cornea, right eye: Secondary | ICD-10-CM | POA: Diagnosis not present

## 2018-03-21 DIAGNOSIS — H01022 Squamous blepharitis right lower eyelid: Secondary | ICD-10-CM | POA: Diagnosis not present

## 2018-03-21 DIAGNOSIS — H1811 Bullous keratopathy, right eye: Secondary | ICD-10-CM | POA: Diagnosis not present

## 2018-03-21 DIAGNOSIS — H01024 Squamous blepharitis left upper eyelid: Secondary | ICD-10-CM | POA: Diagnosis not present

## 2018-03-21 DIAGNOSIS — H01025 Squamous blepharitis left lower eyelid: Secondary | ICD-10-CM | POA: Diagnosis not present

## 2018-03-21 DIAGNOSIS — H01021 Squamous blepharitis right upper eyelid: Secondary | ICD-10-CM | POA: Diagnosis not present

## 2018-03-21 DIAGNOSIS — H18831 Recurrent erosion of cornea, right eye: Secondary | ICD-10-CM | POA: Diagnosis not present

## 2018-05-02 DIAGNOSIS — H35341 Macular cyst, hole, or pseudohole, right eye: Secondary | ICD-10-CM | POA: Diagnosis not present

## 2018-05-02 DIAGNOSIS — H01025 Squamous blepharitis left lower eyelid: Secondary | ICD-10-CM | POA: Diagnosis not present

## 2018-05-02 DIAGNOSIS — H52213 Irregular astigmatism, bilateral: Secondary | ICD-10-CM | POA: Diagnosis not present

## 2018-05-02 DIAGNOSIS — H01022 Squamous blepharitis right lower eyelid: Secondary | ICD-10-CM | POA: Diagnosis not present

## 2018-05-02 DIAGNOSIS — H01024 Squamous blepharitis left upper eyelid: Secondary | ICD-10-CM | POA: Diagnosis not present

## 2018-05-02 DIAGNOSIS — H1859 Other hereditary corneal dystrophies: Secondary | ICD-10-CM | POA: Diagnosis not present

## 2018-05-02 DIAGNOSIS — H18831 Recurrent erosion of cornea, right eye: Secondary | ICD-10-CM | POA: Diagnosis not present

## 2018-05-02 DIAGNOSIS — H01021 Squamous blepharitis right upper eyelid: Secondary | ICD-10-CM | POA: Diagnosis not present

## 2018-05-05 DIAGNOSIS — H35072 Retinal telangiectasis, left eye: Secondary | ICD-10-CM | POA: Diagnosis not present

## 2018-05-05 DIAGNOSIS — H43811 Vitreous degeneration, right eye: Secondary | ICD-10-CM | POA: Diagnosis not present

## 2018-05-05 DIAGNOSIS — G4733 Obstructive sleep apnea (adult) (pediatric): Secondary | ICD-10-CM | POA: Diagnosis not present

## 2018-05-05 DIAGNOSIS — H35071 Retinal telangiectasis, right eye: Secondary | ICD-10-CM | POA: Diagnosis not present

## 2018-05-24 ENCOUNTER — Telehealth: Payer: Self-pay | Admitting: Cardiology

## 2018-05-24 NOTE — Telephone Encounter (Signed)
°*  STAT* If patient is at the pharmacy, call can be transferred to refill team.   1. Which medications need to be refilled? (please list name of each medication and dose if known)  eliqise 5 mg 2. Which pharmacy/location (including street and city if local pharmacy) is medication to be sent to? [facility] [place]   3. Do they need a 30 day or 90 day supply? [place]

## 2018-05-25 ENCOUNTER — Telehealth: Payer: Self-pay

## 2018-05-25 ENCOUNTER — Other Ambulatory Visit: Payer: Self-pay

## 2018-05-25 MED ORDER — APIXABAN 5 MG PO TABS: 5.0000 mg | ORAL_TABLET | Freq: Two times a day (BID) | ORAL | 1 refills | Status: DC

## 2018-05-25 NOTE — Telephone Encounter (Signed)
Blood work done 54month ago (per [employer])  Refill for 90 day supply plus refill sent to prefer pharmacy

## 2018-05-25 NOTE — Telephone Encounter (Signed)
Pt called the office and got transferred to the pharmacy line and left a msg however, pt states he is highly disappointed with out communication and that we should be able to respond to a simple refill request and that it shouldn't take that long. Pt is 68 yo male, 110kg, but the labs listed were from two years ago and I need someone to check kpn to make sure pt is eligible for refillof eliquis 5mg  tab under Dr. Martinique

## 2018-05-26 MED ORDER — APIXABAN 5 MG PO TABS: 5.0000 mg | ORAL_TABLET | Freq: Two times a day (BID) | ORAL | 1 refills | Status: DC

## 2018-05-28 NOTE — Progress Notes (Signed)
Cardiology Office Note   Date:  05/30/2018   ID:  JHASE CREPPEL, DOB 12-06-1950, MRN [id number]  PCP:  [name], MD  Cardiologist: Adith Tejada Martinique MD  Chief Complaint  Patient presents with  . Follow-up    no cardiac concerns   . Coronary Artery Disease  . Atrial Fibrillation      History of Present Illness: Christopher Bradford is a 68 y.o. male who presents for follow-up CAD and atrial fibrillation.   The patient has a past history of ischemic heart disease. He has a past history of a stent to the circumflex in 1998. He had a cardiac catheterization in 2005 showing that the left circumflex stent was widely patent. Patient is on statin therapy. The patient has a history of hypercholesterolemia and essential hypertension and exogenous obesity. He is also the primary caregiver for his wife who has a lot of chronic medical problems.  In September 2016 he was found to be in atrial fibrillation which was asymptomatic.He had presented to his PCP for a physical on 01/28/15 and was incidentally noted to be in rate-controlled atrial fibrillation.  Aspirin was discontinued and he was started on Eliquis 5mg  BID (CHADSVASC = 3).  His heart rate was already well controlled and he did not require any additional A-V node blocking medication. He has not had any stroke or TIA symptoms. Echo 02/14/15 showed normal LV function. Valvular function was good. The LA was severely enlarged and the RA was moderately enlarged. Myoview study in January 2019 was normal.    On follow up today he is doing OK. He has no specific cardiac complaints.  He denies any chest pain, dyspnea, palpitations or dizziness. Still  limited with arthritis. He has lost 11 lbs since last visit from paying more attention to his diet.   Past Medical History:  Diagnosis Date  . Anxiety   . Atrial fibrillation ([place])   . BPH (benign prostatic hyperplasia)   . CAD (coronary artery disease)    a. s/p stent to Cx 1998. b. Stent  (patent by [facility] 2005 with otherwise mild luminal irreg, normal EF at that time.  . Depression   . GERD (gastroesophageal reflux disease)   . History of gout   . Hyperlipidemia   . Hyperplastic colon polyp   . Hypertension   . Obesity   . OSA (obstructive sleep apnea)   . Osteoarthritis     Past Surgical History:  Procedure Laterality Date  . BACK SURGERY    . CARDIAC CATHETERIZATION  1998   stent to left circumflex  . CARDIAC CATHETERIZATION  2005   stent widely patent  . CARPAL TUNNEL RELEASE    . COLONOSCOPY    . KNEE ARTHROSCOPY Left   . SHOULDER SURGERY    . TONSILLECTOMY AND ADENOIDECTOMY     age 68     Current Outpatient Medications  Medication Sig Dispense Refill  . amLODipine (NORVASC) 2.5 MG tablet Take 2.5 mg by mouth daily.      Marland Kitchen apixaban (ELIQUIS) 5 MG TABS tablet Take 1 tablet (5 mg total) by mouth 2 (two) times daily. 180 tablet 3  . azelastine (ASTELIN) 137 MCG/SPRAY nasal spray Place 1 spray into the nose 2 (two) times daily as needed for allergies.     . benazepril (LOTENSIN) 40 MG tablet Take 40 mg by mouth daily.      . busPIRone (BUSPAR) 7.5 MG tablet Take 1 tablet by mouth every morning.  3  .  cetirizine (ZYRTEC) 5 MG tablet Take 5 mg by mouth daily.      . Coenzyme Q10 (CO Q 10 PO) Take 1 capsule by mouth daily.     . diazepam (VALIUM) 2 MG tablet Take 2 mg by mouth daily as needed (sleep).     . diphenhydrAMINE (BENADRYL) 25 MG tablet Take 25 mg by mouth every 6 (six) hours as needed for itching or allergies.    . fluticasone (FLONASE) 50 MCG/ACT nasal spray Place 1 spray into both nostrils daily as needed for allergies or rhinitis.    . Omega-3 Fatty Acids (FISH OIL PO) Take 1 capsule by mouth daily.     . Omeprazole (PRILOSEC PO) Take 20 mg by mouth daily.     . simvastatin (ZOCOR) 40 MG tablet Take 40 mg by mouth daily.     . traMADol (ULTRAM) 50 MG tablet Take 50 mg by mouth every 12 (twelve) hours as needed (pain).   0   No current  facility-administered medications for this visit.     Allergies:   Penicillins    Social History:  The patient  reports that he has quit smoking. His smoking use included cigarettes. He has never used smokeless tobacco. He reports that he does not drink alcohol or use drugs.   Family History:  The patient's family history includes Alcohol abuse in his sister; Cervical cancer in his sister; Dementia in his mother; Hypertension in his father; Prostate cancer in his father; Suicidality in his father.    ROS:  Please see the history of present illness.   Otherwise, review of systems are positive for none.   All other systems are reviewed and negative.    PHYSICAL EXAM: VS:  BP 110/70   Pulse 89   Ht 5\' 9"  (1.753 m)   Wt 231 lb (104.8 kg)   BMI 34.11 kg/m  , BMI Body mass index is 34.11 kg/m. GENERAL:  Well appearing WM in NAD HEENT:  PERRL, EOMI, sclera are clear. Oropharynx is clear. NECK:  No jugular venous distention, carotid upstroke brisk and symmetric, no bruits, no thyromegaly or adenopathy LUNGS:  Clear to auscultation bilaterally CHEST:  Unremarkable HEART:  IRRR,  PMI not displaced or sustained,S1 and S2 within normal limits, no S3, no S4: no clicks, no rubs, no murmurs ABD:  Soft, nontender. BS +, no masses or bruits. No hepatomegaly, no splenomegaly EXT:  2 + pulses throughout, no edema, no cyanosis no clubbing SKIN:  Warm and dry.  No rashes NEURO:  Alert and oriented x 3. Cranial nerves II through XII intact. PSYCH:  Cognitively intact    EKG:  EKG is ordered today. Afib with rate 89. Otherwise normal. I have personally reviewed and interpreted this study.     Recent Labs: No results found for requested labs within last 8760 hours.    Lipid Panel No results found for: CHOL, TRIG, HDL, CHOLHDL, VLDL, LDLCALC, LDLDIRECT    Wt Readings from Last 3 Encounters:  05/30/18 231 lb (104.8 kg)  10/20/17 242 lb 9.6 oz (110 kg)  06/01/17 235 lb (106.6 kg)    Labs  reviewed from primary care dated 01/29/16: cholesterol 145, triglycerides 103, LDL 73, HDL 52.  CMET normal.  Dated 12/28/16: cholesterol 131, triglycerides 83, HDL 49, LDL 66. CBC and chemistries normal.  Dated 01/03/18: cholesterol 135, triglycerides 100, HDL 49, LDL 67. CBC and chemistries normal.  Myoview 06/01/17: Study Highlights     The left ventricular ejection fraction is  mildly decreased (45-54%).  Nuclear stress EF: 46%.  There was no ST segment deviation noted during stress.  The study is normal.  This is a low risk study.   Normal pharmacologic nuclear stress test with no evidence for prior infarct or ischemia.  LVEF appears better than calculated, a correlation with an echocardiogram is recommended.    ASSESSMENT AND PLAN:  1.  CAD s/p previous stent to circumflex coronary artery in 1998. Cardiac catheterization in 2005 showed that the stent was widely patent.  Lexiscan myoview in early 2019 was normal. He is asymptomatic. 2. Essential hypertension- well controlled.  3. Obesity- encouraged continued weight loss.  4. Hypercholesterolemia- well controlled on statin. 5.  Osteoarthritis of bilateral knees 6.  Permanent  atrial fibrillation- rate is well controlled on no AV nodal blocking agents. He is asymptomatic. Continue  anticoagulation with Eliquis.   Disposition: Continue current medication.follow up in 6 months.    Labs/ tests ordered today include:   Orders Placed This Encounter  Procedures  . EKG 12-Lead   Signed, Bettyjo Lundblad Martinique MD, [facility]  05/30/2018 3:26 PM

## 2018-05-30 ENCOUNTER — Encounter: Payer: Self-pay | Admitting: Cardiology

## 2018-05-30 ENCOUNTER — Ambulatory Visit (INDEPENDENT_AMBULATORY_CARE_PROVIDER_SITE_OTHER): Payer: Medicare Other | Admitting: Cardiology

## 2018-05-30 VITALS — BP 110/70 | HR 89 | Ht 69.0 in | Wt 231.0 lb

## 2018-05-30 DIAGNOSIS — I10 Essential (primary) hypertension: Secondary | ICD-10-CM | POA: Diagnosis not present

## 2018-05-30 DIAGNOSIS — I2510 Atherosclerotic heart disease of native coronary artery without angina pectoris: Secondary | ICD-10-CM

## 2018-05-30 DIAGNOSIS — Z9861 Coronary angioplasty status: Secondary | ICD-10-CM

## 2018-05-30 DIAGNOSIS — I482 Chronic atrial fibrillation, unspecified: Secondary | ICD-10-CM | POA: Diagnosis not present

## 2018-05-30 DIAGNOSIS — E785 Hyperlipidemia, unspecified: Secondary | ICD-10-CM

## 2018-05-30 MED ORDER — APIXABAN 5 MG PO TABS: 5.0000 mg | ORAL_TABLET | Freq: Two times a day (BID) | ORAL | 3 refills | Status: DC

## 2018-06-13 ENCOUNTER — Encounter: Payer: Self-pay | Admitting: Podiatry

## 2018-06-13 ENCOUNTER — Ambulatory Visit (INDEPENDENT_AMBULATORY_CARE_PROVIDER_SITE_OTHER): Payer: Medicare Other | Admitting: Podiatry

## 2018-06-13 DIAGNOSIS — L84 Corns and callosities: Secondary | ICD-10-CM | POA: Diagnosis not present

## 2018-06-13 DIAGNOSIS — M779 Enthesopathy, unspecified: Secondary | ICD-10-CM | POA: Diagnosis not present

## 2018-06-13 DIAGNOSIS — H18833 Recurrent erosion of cornea, bilateral: Secondary | ICD-10-CM | POA: Diagnosis not present

## 2018-06-13 DIAGNOSIS — D689 Coagulation defect, unspecified: Secondary | ICD-10-CM

## 2018-06-13 DIAGNOSIS — H01023 Squamous blepharitis right eye, unspecified eyelid: Secondary | ICD-10-CM | POA: Diagnosis not present

## 2018-06-13 DIAGNOSIS — H04123 Dry eye syndrome of bilateral lacrimal glands: Secondary | ICD-10-CM | POA: Diagnosis not present

## 2018-06-13 DIAGNOSIS — H01026 Squamous blepharitis left eye, unspecified eyelid: Secondary | ICD-10-CM | POA: Diagnosis not present

## 2018-06-13 MED ORDER — TRIAMCINOLONE ACETONIDE 10 MG/ML IJ SUSP
10.0000 mg | Freq: Once | INTRAMUSCULAR | Status: AC
  Administered 2018-06-13: 10 mg

## 2018-06-15 NOTE — Progress Notes (Signed)
Subjective:   Patient ID: Christopher Bradford, male   DOB: 68 y.o.   MRN: [id number]   HPI Patient states this lesion is come back and it is been at least a year since his father   ROS      Objective:  Physical Exam  Neurovascular status intact with inflammatory changes around the fifth MPJ left with fluid buildup and pain upon pressure     Assessment:  Keratotic lesion fifth MPJ left with fluid buildup and capsular inflammation fifth MPJ left     Plan:  H&P condition reviewed and also discussed his being on blood thinners and risk of any kind of trimming.  I did do a sterile prep and injected the capsule of the fifth MPJ 2 mg dexamethasone Kenalog 3 mg Xylocaine and then I did sharp sterile debridement of the lesion with no iatrogenic bleeding and patient will return for normal visit as needed and ultimately may require surgery if symptoms persist

## 2018-07-05 DIAGNOSIS — E782 Mixed hyperlipidemia: Secondary | ICD-10-CM | POA: Diagnosis not present

## 2018-07-05 DIAGNOSIS — I2510 Atherosclerotic heart disease of native coronary artery without angina pectoris: Secondary | ICD-10-CM | POA: Diagnosis not present

## 2018-07-05 DIAGNOSIS — M1990 Unspecified osteoarthritis, unspecified site: Secondary | ICD-10-CM | POA: Diagnosis not present

## 2018-07-05 DIAGNOSIS — F322 Major depressive disorder, single episode, severe without psychotic features: Secondary | ICD-10-CM | POA: Diagnosis not present

## 2018-07-05 DIAGNOSIS — G4733 Obstructive sleep apnea (adult) (pediatric): Secondary | ICD-10-CM | POA: Diagnosis not present

## 2018-07-05 DIAGNOSIS — I10 Essential (primary) hypertension: Secondary | ICD-10-CM | POA: Diagnosis not present

## 2018-07-05 DIAGNOSIS — F419 Anxiety disorder, unspecified: Secondary | ICD-10-CM | POA: Diagnosis not present

## 2018-07-05 DIAGNOSIS — I4891 Unspecified atrial fibrillation: Secondary | ICD-10-CM | POA: Diagnosis not present

## 2018-07-05 DIAGNOSIS — J309 Allergic rhinitis, unspecified: Secondary | ICD-10-CM | POA: Diagnosis not present

## 2018-08-04 DIAGNOSIS — H35071 Retinal telangiectasis, right eye: Secondary | ICD-10-CM | POA: Diagnosis not present

## 2018-08-04 DIAGNOSIS — H2512 Age-related nuclear cataract, left eye: Secondary | ICD-10-CM | POA: Diagnosis not present

## 2018-08-04 DIAGNOSIS — H35072 Retinal telangiectasis, left eye: Secondary | ICD-10-CM | POA: Diagnosis not present

## 2018-08-04 DIAGNOSIS — H2511 Age-related nuclear cataract, right eye: Secondary | ICD-10-CM | POA: Diagnosis not present

## 2018-10-13 DIAGNOSIS — M13861 Other specified arthritis, right knee: Secondary | ICD-10-CM | POA: Diagnosis not present

## 2018-10-13 DIAGNOSIS — M13862 Other specified arthritis, left knee: Secondary | ICD-10-CM | POA: Diagnosis not present

## 2018-11-04 ENCOUNTER — Other Ambulatory Visit: Payer: Self-pay | Admitting: Cardiology

## 2018-11-04 ENCOUNTER — Other Ambulatory Visit: Payer: Self-pay

## 2018-11-04 MED ORDER — APIXABAN 5 MG PO TABS: 5.0000 mg | ORAL_TABLET | Freq: Two times a day (BID) | ORAL | 0 refills | Status: DC

## 2018-11-04 NOTE — Telephone Encounter (Signed)
New Message       *STAT* If patient is at the pharmacy, call can be transferred to refill team.   1. Which medications need to be refilled? (please list name of each medication and dose if known) Eliquis   2. Which pharmacy/location (including street and city if local pharmacy) is medication to be sent to? [facility] on [address]   3. Do they need a 30 day or 90 day supply? [place]

## 2018-11-04 NOTE — Telephone Encounter (Signed)
Pt is  68 yr old male who saw Dr. Martinique on 05/30/18, weight at that visit was 104.8Kg. Last noted SCr on 01/03/18 was 0.84, will refill Eliquis 5mg  BID.

## 2018-11-07 MED ORDER — APIXABAN 5 MG PO TABS: 5.0000 mg | ORAL_TABLET | Freq: Two times a day (BID) | ORAL | 1 refills | Status: AC

## 2018-11-07 NOTE — Telephone Encounter (Signed)
68 yo, 104.8 kg 0.84 (12/2017), LOV 05/2018-PJ, Dx AF

## 2018-11-15 DIAGNOSIS — E782 Mixed hyperlipidemia: Secondary | ICD-10-CM | POA: Diagnosis not present

## 2018-11-15 DIAGNOSIS — N400 Enlarged prostate without lower urinary tract symptoms: Secondary | ICD-10-CM | POA: Diagnosis not present

## 2018-11-15 DIAGNOSIS — F322 Major depressive disorder, single episode, severe without psychotic features: Secondary | ICD-10-CM | POA: Diagnosis not present

## 2018-11-15 DIAGNOSIS — I10 Essential (primary) hypertension: Secondary | ICD-10-CM | POA: Diagnosis not present

## 2018-11-15 DIAGNOSIS — I2510 Atherosclerotic heart disease of native coronary artery without angina pectoris: Secondary | ICD-10-CM | POA: Diagnosis not present

## 2018-11-15 DIAGNOSIS — M1990 Unspecified osteoarthritis, unspecified site: Secondary | ICD-10-CM | POA: Diagnosis not present

## 2018-11-15 DIAGNOSIS — I4891 Unspecified atrial fibrillation: Secondary | ICD-10-CM | POA: Diagnosis not present

## 2018-12-17 ENCOUNTER — Encounter

## 2019-01-09 DIAGNOSIS — E782 Mixed hyperlipidemia: Secondary | ICD-10-CM | POA: Diagnosis not present

## 2019-01-09 DIAGNOSIS — I2510 Atherosclerotic heart disease of native coronary artery without angina pectoris: Secondary | ICD-10-CM | POA: Diagnosis not present

## 2019-01-09 DIAGNOSIS — F419 Anxiety disorder, unspecified: Secondary | ICD-10-CM | POA: Diagnosis not present

## 2019-01-09 DIAGNOSIS — Z1389 Encounter for screening for other disorder: Secondary | ICD-10-CM | POA: Diagnosis not present

## 2019-01-09 DIAGNOSIS — D6869 Other thrombophilia: Secondary | ICD-10-CM | POA: Diagnosis not present

## 2019-01-09 DIAGNOSIS — I4891 Unspecified atrial fibrillation: Secondary | ICD-10-CM | POA: Diagnosis not present

## 2019-01-09 DIAGNOSIS — Z0000 Encounter for general adult medical examination without abnormal findings: Secondary | ICD-10-CM | POA: Diagnosis not present

## 2019-01-09 DIAGNOSIS — I10 Essential (primary) hypertension: Secondary | ICD-10-CM | POA: Diagnosis not present

## 2019-01-09 DIAGNOSIS — N400 Enlarged prostate without lower urinary tract symptoms: Secondary | ICD-10-CM | POA: Diagnosis not present

## 2019-01-09 DIAGNOSIS — R7309 Other abnormal glucose: Secondary | ICD-10-CM | POA: Diagnosis not present

## 2019-01-09 DIAGNOSIS — G4733 Obstructive sleep apnea (adult) (pediatric): Secondary | ICD-10-CM | POA: Diagnosis not present

## 2019-01-09 DIAGNOSIS — F322 Major depressive disorder, single episode, severe without psychotic features: Secondary | ICD-10-CM | POA: Diagnosis not present

## 2019-02-02 DIAGNOSIS — H2512 Age-related nuclear cataract, left eye: Secondary | ICD-10-CM | POA: Diagnosis not present

## 2019-02-02 DIAGNOSIS — H43812 Vitreous degeneration, left eye: Secondary | ICD-10-CM | POA: Diagnosis not present

## 2019-02-02 DIAGNOSIS — H35071 Retinal telangiectasis, right eye: Secondary | ICD-10-CM | POA: Diagnosis not present

## 2019-02-02 DIAGNOSIS — H2511 Age-related nuclear cataract, right eye: Secondary | ICD-10-CM | POA: Diagnosis not present

## 2019-02-02 DIAGNOSIS — H43811 Vitreous degeneration, right eye: Secondary | ICD-10-CM | POA: Diagnosis not present

## 2019-02-02 DIAGNOSIS — H35072 Retinal telangiectasis, left eye: Secondary | ICD-10-CM | POA: Diagnosis not present

## 2019-02-22 DIAGNOSIS — Z23 Encounter for immunization: Secondary | ICD-10-CM | POA: Diagnosis not present

## 2019-03-06 DIAGNOSIS — H18833 Recurrent erosion of cornea, bilateral: Secondary | ICD-10-CM | POA: Diagnosis not present

## 2019-03-06 DIAGNOSIS — H0102B Squamous blepharitis left eye, upper and lower eyelids: Secondary | ICD-10-CM | POA: Diagnosis not present

## 2019-03-06 DIAGNOSIS — H04123 Dry eye syndrome of bilateral lacrimal glands: Secondary | ICD-10-CM | POA: Diagnosis not present

## 2019-03-06 DIAGNOSIS — H0102A Squamous blepharitis right eye, upper and lower eyelids: Secondary | ICD-10-CM | POA: Diagnosis not present

## 2019-04-05 DIAGNOSIS — M13861 Other specified arthritis, right knee: Secondary | ICD-10-CM | POA: Diagnosis not present

## 2019-04-05 DIAGNOSIS — M25561 Pain in right knee: Secondary | ICD-10-CM | POA: Diagnosis not present

## 2019-04-05 DIAGNOSIS — M13862 Other specified arthritis, left knee: Secondary | ICD-10-CM | POA: Diagnosis not present

## 2019-04-05 DIAGNOSIS — M25562 Pain in left knee: Secondary | ICD-10-CM | POA: Diagnosis not present

## 2019-06-20 DIAGNOSIS — M25551 Pain in right hip: Secondary | ICD-10-CM | POA: Diagnosis not present

## 2019-06-20 DIAGNOSIS — M545 Low back pain: Secondary | ICD-10-CM | POA: Diagnosis not present

## 2019-06-20 DIAGNOSIS — M1611 Unilateral primary osteoarthritis, right hip: Secondary | ICD-10-CM | POA: Diagnosis not present

## 2019-06-25 ENCOUNTER — Ambulatory Visit: Payer: Medicare Other | Attending: Internal Medicine

## 2019-06-25 DIAGNOSIS — Z23 Encounter for immunization: Secondary | ICD-10-CM | POA: Insufficient documentation

## 2019-06-25 NOTE — Progress Notes (Signed)
   Covid-19 Vaccination Clinic  Name:  DMARION VIVIER    MRN: [id number] DOB: Jun 03, 1950  06/25/2019  Mr. Chilton was observed post Covid-19 immunization for 15 minutes without incidence. He was provided with Vaccine Information Sheet and instruction to access the V-Safe system.   Mr. Nou was instructed to call 911 with any severe reactions post vaccine: Marland Kitchen Difficulty breathing  . Swelling of your face and throat  . A fast heartbeat  . A bad rash all over your body  . Dizziness and weakness    Immunizations Administered    Name Date Dose VIS Date Route   Pfizer COVID-19 Vaccine 06/25/2019  2:31 PM 0.3 mL 04/28/2019 Intramuscular   Manufacturer: [place]   Lot: [id number]   [place]: [id number]

## 2019-07-11 DIAGNOSIS — G4733 Obstructive sleep apnea (adult) (pediatric): Secondary | ICD-10-CM | POA: Diagnosis not present

## 2019-07-11 DIAGNOSIS — I10 Essential (primary) hypertension: Secondary | ICD-10-CM | POA: Diagnosis not present

## 2019-07-11 DIAGNOSIS — E782 Mixed hyperlipidemia: Secondary | ICD-10-CM | POA: Diagnosis not present

## 2019-07-11 DIAGNOSIS — M79674 Pain in right toe(s): Secondary | ICD-10-CM | POA: Diagnosis not present

## 2019-07-11 DIAGNOSIS — I2510 Atherosclerotic heart disease of native coronary artery without angina pectoris: Secondary | ICD-10-CM | POA: Diagnosis not present

## 2019-07-11 DIAGNOSIS — F322 Major depressive disorder, single episode, severe without psychotic features: Secondary | ICD-10-CM | POA: Diagnosis not present

## 2019-07-11 DIAGNOSIS — I4891 Unspecified atrial fibrillation: Secondary | ICD-10-CM | POA: Diagnosis not present

## 2019-07-11 DIAGNOSIS — N400 Enlarged prostate without lower urinary tract symptoms: Secondary | ICD-10-CM | POA: Diagnosis not present

## 2019-07-12 DIAGNOSIS — M25551 Pain in right hip: Secondary | ICD-10-CM | POA: Diagnosis not present

## 2019-07-16 ENCOUNTER — Ambulatory Visit: Payer: Medicare Other

## 2019-07-20 ENCOUNTER — Ambulatory Visit: Payer: Medicare Other | Attending: Internal Medicine

## 2019-07-20 DIAGNOSIS — Z23 Encounter for immunization: Secondary | ICD-10-CM

## 2019-07-20 NOTE — Progress Notes (Signed)
   Covid-19 Vaccination Clinic  Name:  Christopher Bradford    MRN: [id number] DOB: 22-Apr-1951  07/20/2019  [name] was observed post Covid-19 immunization for 15 minutes without incident. He was provided with Vaccine Information Sheet and instruction to access the V-Safe system.   [name] was instructed to call 911 with any severe reactions post vaccine: Marland Kitchen Difficulty breathing  . Swelling of face and throat  . A fast heartbeat  . A bad rash all over body  . Dizziness and weakness   Immunizations Administered    Name Date Dose VIS Date Route   Pfizer COVID-19 Vaccine 07/20/2019  2:31 PM 0.3 mL 04/28/2019 Intramuscular   Manufacturer: [place]   Lot: [id number]   [place]: [id number]

## 2019-08-03 DIAGNOSIS — M170 Bilateral primary osteoarthritis of knee: Secondary | ICD-10-CM | POA: Diagnosis not present

## 2019-08-03 DIAGNOSIS — M25562 Pain in left knee: Secondary | ICD-10-CM | POA: Diagnosis not present

## 2019-08-10 ENCOUNTER — Ambulatory Visit (INDEPENDENT_AMBULATORY_CARE_PROVIDER_SITE_OTHER): Payer: Medicare Other | Admitting: Podiatry

## 2019-08-10 ENCOUNTER — Other Ambulatory Visit: Payer: Self-pay

## 2019-08-10 ENCOUNTER — Encounter: Payer: Self-pay | Admitting: Podiatry

## 2019-08-10 DIAGNOSIS — L84 Corns and callosities: Secondary | ICD-10-CM

## 2019-08-10 DIAGNOSIS — D689 Coagulation defect, unspecified: Secondary | ICD-10-CM | POA: Diagnosis not present

## 2019-08-10 DIAGNOSIS — M779 Enthesopathy, unspecified: Secondary | ICD-10-CM | POA: Diagnosis not present

## 2019-08-10 NOTE — Progress Notes (Signed)
Subjective:   Patient ID: Christopher Bradford, male   DOB: 69 y.o.   MRN: [id number]   HPI Patient states that he is having pain in the outside bottom of his left fifth metatarsal and its been inflamed and sore and making it hard to wear shoe gear comfortably.  Patient states that he tried to trim it himself    ROS      Objective:  Physical Exam  Inflammatory capsulitis fifth MPJ left with pain and lesion formation     Assessment:  Pain upon pressure with fluid buildup     Plan:  Sterile prep injected around the fifth MPJ 3 mg Dexasone Kenalog debrided lesion sharply with no iatrogenic bleeding and reappoint as needed

## 2019-11-01 DIAGNOSIS — M13861 Other specified arthritis, right knee: Secondary | ICD-10-CM | POA: Diagnosis not present

## 2019-11-01 DIAGNOSIS — M25562 Pain in left knee: Secondary | ICD-10-CM | POA: Diagnosis not present

## 2019-11-01 DIAGNOSIS — M13862 Other specified arthritis, left knee: Secondary | ICD-10-CM | POA: Diagnosis not present

## 2020-02-01 DIAGNOSIS — Z5181 Encounter for therapeutic drug level monitoring: Secondary | ICD-10-CM | POA: Diagnosis not present

## 2020-02-01 DIAGNOSIS — M533 Sacrococcygeal disorders, not elsewhere classified: Secondary | ICD-10-CM | POA: Diagnosis not present

## 2020-02-01 DIAGNOSIS — I4891 Unspecified atrial fibrillation: Secondary | ICD-10-CM | POA: Diagnosis not present

## 2020-02-01 DIAGNOSIS — I10 Essential (primary) hypertension: Secondary | ICD-10-CM | POA: Diagnosis not present

## 2020-02-01 DIAGNOSIS — Z0000 Encounter for general adult medical examination without abnormal findings: Secondary | ICD-10-CM | POA: Diagnosis not present

## 2020-02-01 DIAGNOSIS — Z79899 Other long term (current) drug therapy: Secondary | ICD-10-CM | POA: Diagnosis not present

## 2020-02-01 DIAGNOSIS — K219 Gastro-esophageal reflux disease without esophagitis: Secondary | ICD-10-CM | POA: Diagnosis not present

## 2020-02-01 DIAGNOSIS — G8929 Other chronic pain: Secondary | ICD-10-CM | POA: Diagnosis not present

## 2020-02-01 DIAGNOSIS — E7800 Pure hypercholesterolemia, unspecified: Secondary | ICD-10-CM | POA: Diagnosis not present

## 2020-02-01 DIAGNOSIS — M25561 Pain in right knee: Secondary | ICD-10-CM | POA: Diagnosis not present

## 2020-02-01 DIAGNOSIS — R600 Localized edema: Secondary | ICD-10-CM | POA: Diagnosis not present

## 2020-02-01 DIAGNOSIS — I2510 Atherosclerotic heart disease of native coronary artery without angina pectoris: Secondary | ICD-10-CM | POA: Diagnosis not present

## 2020-02-06 DIAGNOSIS — Z0000 Encounter for general adult medical examination without abnormal findings: Secondary | ICD-10-CM | POA: Diagnosis not present

## 2020-02-12 DIAGNOSIS — H18593 Other hereditary corneal dystrophies, bilateral: Secondary | ICD-10-CM | POA: Diagnosis not present

## 2020-02-12 DIAGNOSIS — H43813 Vitreous degeneration, bilateral: Secondary | ICD-10-CM | POA: Diagnosis not present

## 2020-02-12 DIAGNOSIS — H35073 Retinal telangiectasis, bilateral: Secondary | ICD-10-CM | POA: Diagnosis not present

## 2020-02-13 DIAGNOSIS — M1711 Unilateral primary osteoarthritis, right knee: Secondary | ICD-10-CM | POA: Diagnosis not present

## 2020-02-13 DIAGNOSIS — M170 Bilateral primary osteoarthritis of knee: Secondary | ICD-10-CM | POA: Diagnosis not present

## 2020-02-13 DIAGNOSIS — M1712 Unilateral primary osteoarthritis, left knee: Secondary | ICD-10-CM | POA: Diagnosis not present

## 2020-02-27 DIAGNOSIS — Z23 Encounter for immunization: Secondary | ICD-10-CM | POA: Diagnosis not present

## 2020-02-27 DIAGNOSIS — I2510 Atherosclerotic heart disease of native coronary artery without angina pectoris: Secondary | ICD-10-CM | POA: Diagnosis not present

## 2020-02-27 DIAGNOSIS — I4891 Unspecified atrial fibrillation: Secondary | ICD-10-CM | POA: Diagnosis not present

## 2020-02-27 DIAGNOSIS — G4733 Obstructive sleep apnea (adult) (pediatric): Secondary | ICD-10-CM | POA: Diagnosis not present

## 2020-02-27 DIAGNOSIS — E7800 Pure hypercholesterolemia, unspecified: Secondary | ICD-10-CM | POA: Diagnosis not present

## 2020-03-08 DIAGNOSIS — Z23 Encounter for immunization: Secondary | ICD-10-CM | POA: Diagnosis not present

## 2020-03-14 DIAGNOSIS — H5213 Myopia, bilateral: Secondary | ICD-10-CM | POA: Diagnosis not present

## 2020-03-14 DIAGNOSIS — H17821 Peripheral opacity of cornea, right eye: Secondary | ICD-10-CM | POA: Diagnosis not present

## 2020-03-14 DIAGNOSIS — H25813 Combined forms of age-related cataract, bilateral: Secondary | ICD-10-CM | POA: Diagnosis not present

## 2020-03-14 DIAGNOSIS — H35341 Macular cyst, hole, or pseudohole, right eye: Secondary | ICD-10-CM | POA: Diagnosis not present

## 2020-03-27 DIAGNOSIS — R059 Cough, unspecified: Secondary | ICD-10-CM | POA: Diagnosis not present

## 2020-03-27 DIAGNOSIS — R0602 Shortness of breath: Secondary | ICD-10-CM | POA: Diagnosis not present

## 2020-04-01 DIAGNOSIS — Z20822 Contact with and (suspected) exposure to covid-19: Secondary | ICD-10-CM | POA: Diagnosis not present

## 2020-04-09 DIAGNOSIS — M170 Bilateral primary osteoarthritis of knee: Secondary | ICD-10-CM | POA: Diagnosis not present

## 2020-04-24 DIAGNOSIS — M170 Bilateral primary osteoarthritis of knee: Secondary | ICD-10-CM | POA: Diagnosis not present

## 2020-05-01 DIAGNOSIS — F419 Anxiety disorder, unspecified: Secondary | ICD-10-CM | POA: Diagnosis not present

## 2020-05-06 DIAGNOSIS — I10 Essential (primary) hypertension: Secondary | ICD-10-CM | POA: Diagnosis not present

## 2020-05-06 DIAGNOSIS — I4891 Unspecified atrial fibrillation: Secondary | ICD-10-CM | POA: Diagnosis not present
